# Patient Record
Sex: Male | Born: 1950 | Race: Black or African American | Hispanic: No | Marital: Married | Smoking: Former smoker
Health system: Southern US, Community
[De-identification: ages and names within clinical notes are randomized; demographics above are authoritative.]

## PROBLEM LIST (undated history)

## (undated) DIAGNOSIS — I10 Essential (primary) hypertension: Secondary | ICD-10-CM

## (undated) DIAGNOSIS — M1990 Unspecified osteoarthritis, unspecified site: Secondary | ICD-10-CM

## (undated) HISTORY — PX: COLONOSCOPY: SHX174

## (undated) SURGERY — ARTHROSCOPY, KNEE
Anesthesia: General | Laterality: Right

## (undated) SURGERY — ARTHROPLASTY, KNEE, TOTAL
Anesthesia: General | Laterality: Right

---
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
## 2005-02-14 ENCOUNTER — Emergency Department (HOSPITAL_COMMUNITY): Admission: EM | Admit: 2005-02-14 | Discharge: 2005-02-14 | Payer: Self-pay | Admitting: Emergency Medicine

## 2008-05-20 ENCOUNTER — Encounter: Admission: RE | Admit: 2008-05-20 | Discharge: 2008-05-20 | Payer: Self-pay | Admitting: Specialist

## 2010-03-25 ENCOUNTER — Encounter (INDEPENDENT_AMBULATORY_CARE_PROVIDER_SITE_OTHER): Payer: Self-pay | Admitting: *Deleted

## 2010-09-13 NOTE — Letter (Signed)
Summary: Colonoscopy Letter  [facility]  [address] [place], [place] [address]   Phone: [phone]  Fax: [phone]      March 25, 2010 MRN: [id number]   Jeffery Price [address] [place], [place]  [address]   Dear [name],   According to your medical record, it is time for you to schedule a Colonoscopy. The American Cancer Society recommends this procedure as a method to detect early colon cancer. Patients with a family history of colon cancer, or a personal history of colon polyps or inflammatory bowel disease are at increased risk.  This letter has beeen generated based on the recommendations made at the time of your procedure. If you feel that in your particular situation this may no longer apply, please contact our office.  Please call our office at [phone] to schedule this appointment or to update your records at your earliest convenience.  Thank you for cooperating with [place] to provide you with the very best care possible.   Sincerely,  Judie Petit T. Russella Dar, M.D.  [facility] [phone]

## 2010-11-16 ENCOUNTER — Other Ambulatory Visit: Payer: Self-pay | Admitting: Family Medicine

## 2010-11-16 ENCOUNTER — Ambulatory Visit
Admission: RE | Admit: 2010-11-16 | Discharge: 2010-11-16 | Disposition: A | Discharge status: Home / Self Care | Payer: Medicare Other | Source: Ambulatory Visit | Attending: Family Medicine | Admitting: Family Medicine

## 2010-11-16 DIAGNOSIS — G8929 Other chronic pain: Secondary | ICD-10-CM

## 2011-01-16 ENCOUNTER — Other Ambulatory Visit: Payer: Self-pay | Admitting: Neurosurgery

## 2011-01-16 DIAGNOSIS — M25552 Pain in left hip: Secondary | ICD-10-CM

## 2011-01-21 ENCOUNTER — Ambulatory Visit
Admission: RE | Admit: 2011-01-21 | Discharge: 2011-01-21 | Disposition: A | Discharge status: Home / Self Care | Payer: Medicare Other | Source: Ambulatory Visit | Attending: Neurosurgery | Admitting: Neurosurgery

## 2011-01-21 DIAGNOSIS — M25552 Pain in left hip: Secondary | ICD-10-CM

## 2011-05-15 HISTORY — PX: JOINT REPLACEMENT: SHX530

## 2011-05-26 ENCOUNTER — Other Ambulatory Visit (HOSPITAL_COMMUNITY): Payer: Self-pay | Admitting: Orthopedic Surgery

## 2011-05-26 ENCOUNTER — Encounter (HOSPITAL_COMMUNITY)
Admission: RE | Admit: 2011-05-26 | Discharge: 2011-05-26 | Disposition: A | Discharge status: Home / Self Care | Payer: Medicare Other | Source: Ambulatory Visit | Attending: Orthopedic Surgery | Admitting: Orthopedic Surgery

## 2011-05-26 DIAGNOSIS — M169 Osteoarthritis of hip, unspecified: Secondary | ICD-10-CM

## 2011-05-26 LAB — URINALYSIS, ROUTINE W REFLEX MICROSCOPIC
Glucose, UA: NEGATIVE mg/dL
Ketones, ur: 15 mg/dL — AB
Leukocytes, UA: NEGATIVE
pH: 5 (ref 5.0–8.0)

## 2011-05-26 LAB — CBC
HCT: 42.3 % (ref 39.0–52.0)
Hemoglobin: 14.9 g/dL (ref 13.0–17.0)
MCH: 33.7 pg (ref 26.0–34.0)
RBC: 4.42 MIL/uL (ref 4.22–5.81)

## 2011-05-26 LAB — BASIC METABOLIC PANEL
CO2: 27 mEq/L (ref 19–32)
Calcium: 10.1 mg/dL (ref 8.4–10.5)
Creatinine, Ser: 1.2 mg/dL (ref 0.50–1.35)
Glucose, Bld: 103 mg/dL — ABNORMAL HIGH (ref 70–99)

## 2011-05-26 LAB — SURGICAL PCR SCREEN: Staphylococcus aureus: NEGATIVE

## 2011-06-01 ENCOUNTER — Inpatient Hospital Stay (HOSPITAL_COMMUNITY): Payer: Medicare Other

## 2011-06-01 ENCOUNTER — Inpatient Hospital Stay (HOSPITAL_COMMUNITY)
Admission: RE | Admit: 2011-06-01 | Discharge: 2011-06-06 | DRG: 470 | Disposition: A | Discharge status: Home Health | Payer: Medicare Other | Source: Ambulatory Visit | Attending: Orthopedic Surgery | Admitting: Orthopedic Surgery

## 2011-06-01 DIAGNOSIS — M169 Osteoarthritis of hip, unspecified: Principal | ICD-10-CM | POA: Diagnosis present

## 2011-06-01 DIAGNOSIS — M1610 Unilateral primary osteoarthritis, unspecified hip: Principal | ICD-10-CM | POA: Diagnosis present

## 2011-06-01 DIAGNOSIS — I10 Essential (primary) hypertension: Secondary | ICD-10-CM | POA: Diagnosis present

## 2011-06-01 DIAGNOSIS — Z01812 Encounter for preprocedural laboratory examination: Secondary | ICD-10-CM

## 2011-06-01 LAB — HEPATIC FUNCTION PANEL
ALT: 20 U/L (ref 0–53)
Albumin: 3.6 g/dL (ref 3.5–5.2)
Alkaline Phosphatase: 46 U/L (ref 39–117)
Total Protein: 6.7 g/dL (ref 6.0–8.3)

## 2011-06-01 LAB — TYPE AND SCREEN: ABO/RH(D): A NEG

## 2011-06-02 LAB — CBC
MCHC: 35.8 g/dL (ref 30.0–36.0)
Platelets: 208 10*3/uL (ref 150–400)

## 2011-06-03 LAB — CBC
MCH: 32.6 pg (ref 26.0–34.0)
MCHC: 34.9 g/dL (ref 30.0–36.0)
RDW: 11.9 % (ref 11.5–15.5)
WBC: 9 10*3/uL (ref 4.0–10.5)

## 2011-06-04 LAB — PROTIME-INR: INR: 1.9 — ABNORMAL HIGH (ref 0.00–1.49)

## 2011-06-05 LAB — PROTIME-INR: Prothrombin Time: 28.8 seconds — ABNORMAL HIGH (ref 11.6–15.2)

## 2011-06-06 LAB — CBC
MCH: 32.7 pg (ref 26.0–34.0)
MCV: 92.1 fL (ref 78.0–100.0)
Platelets: 349 10*3/uL (ref 150–400)
RBC: 3.15 MIL/uL — ABNORMAL LOW (ref 4.22–5.81)
RDW: 11.7 % (ref 11.5–15.5)
WBC: 5.1 10*3/uL (ref 4.0–10.5)

## 2011-06-06 LAB — PROTIME-INR: Prothrombin Time: 28.9 seconds — ABNORMAL HIGH (ref 11.6–15.2)

## 2011-06-06 NOTE — Op Note (Signed)
[name], Jeffery Price NO.:  [id number]  MEDICAL RECORD NO.:  [id number]  LOCATION:  5010                         FACILITY:  [facility]  PHYSICIAN:  Myrtie Neither, MD      DATE OF BIRTH:  1951/01/22  DATE OF PROCEDURE: DATE OF DISCHARGE:                              OPERATIVE REPORT   PREOPERATIVE DIAGNOSES: 1. Degenerative joint disease, left hip 2. Acetabular cyst, left hip.  POSTOPERATIVE DIAGNOSES: 1. Degenerative joint disease, left hip 2. Acetabular cyst, left hip.  ANESTHESIA:  General.  PROCEDURE:  Left total hip arthroplasty, Biomet implant, bone grafting of cystic lesions in left acetabulum  The patient was taken to the operating room.  After given adequate preop medication and given general anesthesia and intubated, the patient was placed in left lateral position.  Left hip was prepped with DuraPrep and draped in sterile manner.  Bovie used for hemostasis.  Posterior southern approach was made over the left hip going through the skin and subcutaneous tissue and down through the fascia.  Short rotators were identified and released.  Capsule was released and excised.  Hip was dislocated.  Femoral neck cutting jig was put in place and femoral head was resected with the acetabulum exposed with retractors.  Soft tissue resection about the acetabulum was done.  Identification of 3 large cystic areas in the acetabulum were identified.  These were curetted. Reaming was then started with a 51 mm and proceeded up to 54 mm. Reaming was done down to the bleeding surface.  Bone graft was retrieved from the femoral head.  This was crashed and impact into the 3 cystic defects.  Reamer was then put into reverse to further impact the bone graft.  Acetabular shell, 53 mm, 4 hole, was then put in place and stabilized with 2 cancellous screws.  Trial acetabular liner with posterior lip was then put in place.  Attention was then turned to the proximal femur.  Reaming  down the femoral canal was started out with a cookie cutter and followed with reamer.  Rasping was done up to 11 mm, which was down to good cortical surface.  Calcar reamer was then put in place and proximal femur was smoothed down.  Large anterior lip osteophyte was identified and resected with the use of osteotome.  A -6, -3, and standard heads were trialled and -3 was found to give the most stability, full flexion and extension and internal and external rotation, good stability without subluxing.  Final implants that we used were a 54, 3-hole porous-coated shell, 36-mm acetabular liner, 2 cancellous screws, and 36-mm modular head, - 3.  With all implants in place, again range of motion was tested and found was to be good and stable, good internal and external rotation and good leg length.  Copious and abundant irrigation was done. Wound closure was then done with 0 Vicryl for the fascia, 2-0 for subcutaneous, and skin staples for the skin.  Hip abduction pillow was then applied.  Compressive dressing was applied.  The patient went to recovery room in stable and satisfactory condition.     Myrtie Neither, MD     AC/MEDQ  D:  06/01/2011  T:  06/01/2011  Job:  [id number]  Electronically Signed by Myrtie Neither MD on 06/06/2011 11:22:23 AM

## 2011-06-06 NOTE — H&P (Signed)
  Jeffery Price, KROTZER NO.:  [id number]  MEDICAL RECORD NO.:  [id number]  LOCATION:  5010                         FACILITY:  [facility]  PHYSICIAN:  Myrtie Neither, MD      DATE OF BIRTH:  05/22/1951  DATE OF ADMISSION:  06/01/2011 DATE OF DISCHARGE:                             HISTORY & PHYSICAL   CHIEF COMPLAINT:  Painful stiff left hip.  HISTORY OF PRESENT ILLNESS:  This is a 60 year old who has been having degenerative arthropathy involving this left hip for many years and recently has been able to get treatment with the use of Medicaid. Patient has been recently treated with anti-inflammatories and use of cane and as well as hydrocodone to relieve pain.  PAST MEDICAL HISTORY:  Hypertension, degenerative joint arthropathy.  No history of diabetes mellitus.  ALLERGIES:  None known.  SOCIAL HISTORY:  Has been that of use of alcohol but no use of tobacco or illegal drugs.  FAMILY HISTORY:  That of hypertension.  REVIEW OF SYSTEMS:  No cardiac, respiratory, no urinary or bowel symptoms.  MEDICATIONS:  That of hydrocodone 10/650, Decadron 4 mg p.r.n., amlodipine/benazepril 10/40 mg daily.  Etodolac 400 mg b.i.d.  PHYSICAL EXAMINATION:  GENERAL:  Alert and oriented, with obvious antalgic gait. VITAL SIGNS:  Blood pressure 164/90, respirations 18, pulse 102, temperature 99, weight 79 kg, height 67 inches, O2 saturation 98%. HEAD:  Normocephalic.  Eyes and conjunctivae are clear. NECK:  Supple. CHEST:  Clear. CARDIAC:  S1, S2, regular. EXTREMITIES:  Left hip tender anterior and lateral ankylotic with very little rotation, flexion up to 90 degrees and shortening on the left side, three-eighth of an inch.  LABORATORY DATA:  X-rays revealed sclerosis, cystic lesions in the acetabulum.  Osteophytes and sclerosis, severe degenerative joint disease.  IMPRESSION:  Severe degenerative joint disease left hip with a cystic lesion in the acetabulum,  hypertension.  PLAN:  Left total hip arthroplasty.     Myrtie Neither, MD     AC/MEDQ  D:  06/01/2011  T:  06/01/2011  Job:  [id number]  Electronically Signed by Myrtie Neither MD on 06/06/2011 11:22:28 AM

## 2011-06-06 NOTE — Discharge Summary (Signed)
  NAMETORRY, ISTRE NO.:  [id number]  MEDICAL RECORD NO.:  [id number]  LOCATION:  5010                         FACILITY:  [facility]  PHYSICIAN:  Myrtie Neither, MD      DATE OF BIRTH:  28-Oct-1950  DATE OF ADMISSION:  06/01/2011 DATE OF DISCHARGE:  06/06/2011                              DISCHARGE SUMMARY   ADMITTING DIAGNOSIS:  Degenerative joint disease, left hip.  DISCHARGE DIAGNOSIS:  Degenerative joint disease, left hip.  COMPLICATIONS:  None.  INFECTIONS:  None.  OPERATION:  Left total hip arthroplasty.  PERTINENT HISTORY:  A 60 year old male being followed for degenerative arthropathy of his left hip in the office.  The patient is being treated with anti-inflammatories and pain medication and with use of cane.  The patient has been unable to get treatment up until recently due to lack of insurance coverage.  Pertinent physical was that of the left hip tender anterior and laterally with very little range of motion, very little rotation, flexion up to 90 degrees, and shortening of the __________.  X-rays revealed sclerosis, cystic, and lesions in the acetabulum, osteophytes, and sclerosis.  HOSPITAL COURSE:  The patient underwent preop laboratory; CBC, UA, CMET, PT, PTT, platelet count, EKG, chest x-ray.  The patient's labs found to be stable enough to undergo surgery.  The patient underwent left total hip arthroplasty.  He tolerated the procedure quite well, had difficulty with pain control but was brought under control with full-dose Dilaudid IV.  The patient's pain is presently under control with use of Percocet 1-2 q.4 p.r.n. for pain.  Partial weightbearing. 50% on the left side, physical therapy.  His INR is 1.9.  H and H is stable at 10 and 33.  The patient is afebrile.  Wound is healing quite well, swelling is down. The patient is using spirometry quite well.  The patient will be discharged with Home Health and Physical Therapy to return to the  office in 1 week.  The patient is being discharged on Percocet 1-2 q.4 p.r.n. for pain, ferrous sulfate 325 mg t.i.d., Robaxin 500 mg b.i.d., and Coumadin 5 mg daily.  Home Health and Physical Therapy with INRs to be checked for regulation of Coumadin.  The patient is being discharged in stable and satisfactory condition.     Myrtie Neither, MD     AC/MEDQ  D:  06/05/2011  T:  06/05/2011  Job:  [id number]  Electronically Signed by Myrtie Neither MD on 06/06/2011 11:22:36 AM

## 2011-06-07 LAB — TYPE AND SCREEN
ABO/RH(D): A NEG
Antibody Screen: NEGATIVE
PT AG Type: NEGATIVE
Unit division: 0

## 2011-11-17 ENCOUNTER — Other Ambulatory Visit: Payer: Self-pay | Admitting: Orthopedic Surgery

## 2011-11-17 ENCOUNTER — Ambulatory Visit
Admission: RE | Admit: 2011-11-17 | Discharge: 2011-11-17 | Disposition: A | Discharge status: Home / Self Care | Payer: Medicare Other | Source: Ambulatory Visit | Attending: Orthopedic Surgery | Admitting: Orthopedic Surgery

## 2011-11-17 DIAGNOSIS — M25569 Pain in unspecified knee: Secondary | ICD-10-CM

## 2011-12-27 ENCOUNTER — Encounter (HOSPITAL_COMMUNITY): Payer: Self-pay | Admitting: Respiratory Therapy

## 2011-12-29 ENCOUNTER — Encounter (HOSPITAL_COMMUNITY): Payer: Self-pay

## 2011-12-29 ENCOUNTER — Encounter (HOSPITAL_COMMUNITY)
Admission: RE | Admit: 2011-12-29 | Discharge: 2011-12-29 | Disposition: A | Discharge status: Home / Self Care | Payer: Medicare Other | Source: Ambulatory Visit | Attending: Orthopedic Surgery | Admitting: Orthopedic Surgery

## 2011-12-29 HISTORY — DX: Essential (primary) hypertension: I10

## 2011-12-29 HISTORY — DX: Unspecified osteoarthritis, unspecified site: M19.90

## 2011-12-29 LAB — CBC
MCH: 33.3 pg (ref 26.0–34.0)
MCV: 95.3 fL (ref 78.0–100.0)
Platelets: 236 10*3/uL (ref 150–400)
RBC: 4.26 MIL/uL (ref 4.22–5.81)
RDW: 13 % (ref 11.5–15.5)

## 2011-12-29 LAB — BASIC METABOLIC PANEL
BUN: 8 mg/dL (ref 6–23)
Calcium: 9.6 mg/dL (ref 8.4–10.5)
Creatinine, Ser: 1.08 mg/dL (ref 0.50–1.35)
GFR calc Af Amer: 82 mL/min — ABNORMAL LOW (ref >90)
GFR calc non Af Amer: 71 mL/min — ABNORMAL LOW (ref >90)
Glucose, Bld: 97 mg/dL (ref 70–99)
Potassium: 3.7 mEq/L (ref 3.5–5.1)

## 2011-12-29 LAB — SURGICAL PCR SCREEN: MRSA, PCR: NEGATIVE

## 2011-12-29 NOTE — Progress Notes (Signed)
Telephone call to Dr Loleta Chance @[facility]. Pt off BP meds for 3 months due to expense. Dr Loleta Chance will see patient today @ 2:45 for evaluation and treatment of HTN.

## 2011-12-29 NOTE — Pre-Procedure Instructions (Signed)
20 DIONTA LARKE  12/29/2011   Your procedure is scheduled on:  Thursday, May 23  Report to [facility] at 0700 AM.  Call this number if you have problems the morning of surgery: [phone]   Remember:   Do not eat food:After Midnight.  May have clear liquids: up to 4 Hours before arrival.( 3:00am)  Clear liquids include soda, tea, black coffee, apple or grape juice, broth.  Take these medicines the morning of surgery with A SIP OF WATER: *Call to discuss blood pressure medication**   Do not wear jewelry, make-up or nail polish.  Do not wear lotions, powders, or perfumes. You may wear deodorant.  Do not shave 48 hours prior to surgery. Men may shave face and neck.  Do not bring valuables to the hospital.  Contacts, dentures or bridgework may not be worn into surgery.  Leave suitcase in the car. After surgery it may be brought to your room.  For patients admitted to the hospital, checkout time is 11:00 AM the day of discharge.   Patients discharged the day of surgery will not be allowed to drive home.  Name and phone number of your driver: *Spouse, Margarett**  Special Instructions: CHG Shower Use Special Wash: 1/2 bottle night before surgery and 1/2 bottle morning of surgery.   Please read over the following fact sheets that you were given: Pain Booklet, Coughing and Deep Breathing, MRSA Information and Surgical Site Infection Prevention

## 2012-01-01 ENCOUNTER — Other Ambulatory Visit: Payer: Self-pay | Admitting: Orthopedic Surgery

## 2012-01-01 NOTE — Progress Notes (Signed)
Dr. Celene Skeen office notified that orders not in EPIC.  Receptionist stated she would tell nurse/Dr. Carter.//L. Starla Deller,RN

## 2012-01-01 NOTE — Progress Notes (Signed)
Pt called to ask if he should take his BP med- amlodipine the am of surgery.  I informed him that "yes" he should take it the am of surgery.

## 2012-01-02 NOTE — Progress Notes (Signed)
Bmet obtained during PAT appt prior to receiving Dr. Celene Skeen orders.  Hepatic Function ordered to obtain missing labs for CMET results.//L. Ricardo Schubach,RN

## 2012-01-03 MED ORDER — CHLORHEXIDINE GLUCONATE 4 % EX LIQD: 60.0000 mL | Freq: Once | CUTANEOUS | Status: DC

## 2012-01-03 MED ORDER — CEFAZOLIN SODIUM 1-5 GM-% IV SOLN: 1.0000 g | INTRAVENOUS | Status: DC

## 2012-01-03 MED ORDER — CEFAZOLIN SODIUM-DEXTROSE 2-3 GM-% IV SOLR
2.0000 g | INTRAVENOUS | Status: AC
  Administered 2012-01-04: 2 g via INTRAVENOUS
  Filled 2012-01-03: qty 50

## 2012-01-04 ENCOUNTER — Encounter (HOSPITAL_COMMUNITY)
Admission: RE | Disposition: A | Discharge status: Home / Self Care | Payer: Self-pay | Source: Ambulatory Visit | Attending: Orthopedic Surgery

## 2012-01-04 ENCOUNTER — Encounter (HOSPITAL_COMMUNITY): Payer: Self-pay | Admitting: Anesthesiology

## 2012-01-04 ENCOUNTER — Ambulatory Visit (HOSPITAL_COMMUNITY): Payer: Medicare Other | Admitting: Anesthesiology

## 2012-01-04 ENCOUNTER — Ambulatory Visit (HOSPITAL_COMMUNITY)
Admission: RE | Admit: 2012-01-04 | Discharge: 2012-01-04 | Disposition: A | Discharge status: Home / Self Care | Payer: Medicare Other | Source: Ambulatory Visit | Attending: Orthopedic Surgery | Admitting: Orthopedic Surgery

## 2012-01-04 ENCOUNTER — Encounter (HOSPITAL_COMMUNITY): Payer: Self-pay | Admitting: *Deleted

## 2012-01-04 DIAGNOSIS — M23302 Other meniscus derangements, unspecified lateral meniscus, unspecified knee: Secondary | ICD-10-CM | POA: Insufficient documentation

## 2012-01-04 DIAGNOSIS — M1A9XX0 Chronic gout, unspecified, without tophus (tophi): Secondary | ICD-10-CM | POA: Insufficient documentation

## 2012-01-04 DIAGNOSIS — I10 Essential (primary) hypertension: Secondary | ICD-10-CM | POA: Insufficient documentation

## 2012-01-04 DIAGNOSIS — Z01812 Encounter for preprocedural laboratory examination: Secondary | ICD-10-CM | POA: Insufficient documentation

## 2012-01-04 DIAGNOSIS — M1A00X0 Idiopathic chronic gout, unspecified site, without tophus (tophi): Secondary | ICD-10-CM | POA: Insufficient documentation

## 2012-01-04 DIAGNOSIS — M23305 Other meniscus derangements, unspecified medial meniscus, unspecified knee: Secondary | ICD-10-CM | POA: Insufficient documentation

## 2012-01-04 DIAGNOSIS — G8918 Other acute postprocedural pain: Secondary | ICD-10-CM

## 2012-01-04 HISTORY — PX: KNEE ARTHROSCOPY: SHX127

## 2012-01-04 LAB — HEPATIC FUNCTION PANEL
ALT: 22 U/L (ref 0–53)
AST: 37 U/L (ref 0–37)
Albumin: 3.9 g/dL (ref 3.5–5.2)
Alkaline Phosphatase: 53 U/L (ref 39–117)
Total Bilirubin: 0.4 mg/dL (ref 0.3–1.2)
Total Protein: 7.4 g/dL (ref 6.0–8.3)

## 2012-01-04 SURGERY — ARTHROSCOPY, KNEE
Anesthesia: General | Site: Knee | Laterality: Right | Wound class: Clean

## 2012-01-04 MED ORDER — LIDOCAINE HCL (CARDIAC) 20 MG/ML IV SOLN
INTRAVENOUS | Status: DC | PRN
  Administered 2012-01-04: 80 mg via INTRAVENOUS

## 2012-01-04 MED ORDER — PROPOFOL 10 MG/ML IV EMUL
INTRAVENOUS | Status: DC | PRN
  Administered 2012-01-04: 50 mg via INTRAVENOUS
  Administered 2012-01-04: 200 mg via INTRAVENOUS

## 2012-01-04 MED ORDER — LACTATED RINGERS IV SOLN
INTRAVENOUS | Status: DC
  Administered 2012-01-04: 08:00:00 via INTRAVENOUS

## 2012-01-04 MED ORDER — LACTATED RINGERS IV SOLN
INTRAVENOUS | Status: DC | PRN
  Administered 2012-01-04 (×2): via INTRAVENOUS

## 2012-01-04 MED ORDER — BUPIVACAINE HCL (PF) 0.25 % IJ SOLN
INTRAMUSCULAR | Status: DC | PRN
  Administered 2012-01-04: 20 mL

## 2012-01-04 MED ORDER — MIDAZOLAM HCL 2 MG/2ML IJ SOLN: 1.0000 mg | INTRAMUSCULAR | Status: DC | PRN

## 2012-01-04 MED ORDER — MIDAZOLAM HCL 5 MG/5ML IJ SOLN
INTRAMUSCULAR | Status: DC | PRN
  Administered 2012-01-04 (×2): 1 mg via INTRAVENOUS

## 2012-01-04 MED ORDER — LORAZEPAM 2 MG/ML IJ SOLN
1.0000 mg | Freq: Once | INTRAMUSCULAR | Status: DC | PRN
Start: 2012-01-04 — End: 2012-01-04

## 2012-01-04 MED ORDER — HYDROMORPHONE HCL PF 1 MG/ML IJ SOLN
0.2500 mg | INTRAMUSCULAR | Status: DC | PRN
  Administered 2012-01-04 (×2): 0.5 mg via INTRAVENOUS

## 2012-01-04 MED ORDER — DEXTROSE 5 % IV SOLN
INTRAVENOUS | Status: DC | PRN
  Administered 2012-01-04: 09:00:00 via INTRAVENOUS

## 2012-01-04 MED ORDER — FENTANYL CITRATE 0.05 MG/ML IJ SOLN: 50.0000 ug | INTRAMUSCULAR | Status: DC | PRN

## 2012-01-04 MED ORDER — SODIUM CHLORIDE 0.9 % IR SOLN
Status: DC | PRN
  Administered 2012-01-04: 1000 mL

## 2012-01-04 MED ORDER — ONDANSETRON HCL 4 MG/2ML IJ SOLN
INTRAMUSCULAR | Status: DC | PRN
  Administered 2012-01-04: 4 mg via INTRAVENOUS

## 2012-01-04 MED ORDER — OXYCODONE-ACETAMINOPHEN 7.5-325 MG PO TABS: 1.0000 | ORAL_TABLET | ORAL | Status: AC | PRN

## 2012-01-04 MED ORDER — FENTANYL CITRATE 0.05 MG/ML IJ SOLN
INTRAMUSCULAR | Status: DC | PRN
  Administered 2012-01-04: 100 ug via INTRAVENOUS
  Administered 2012-01-04 (×2): 50 ug via INTRAVENOUS

## 2012-01-04 SURGICAL SUPPLY — 33 items
BANDAGE ELASTIC 4 VELCRO ST LF (GAUZE/BANDAGES/DRESSINGS) ×2 IMPLANT
BANDAGE ELASTIC 6 VELCRO ST LF (GAUZE/BANDAGES/DRESSINGS) ×2 IMPLANT
BANDAGE GAUZE ELAST BULKY 4 IN (GAUZE/BANDAGES/DRESSINGS) ×2 IMPLANT
BLADE CUDA 5.5 (BLADE) IMPLANT
BLADE GREAT WHITE 4.2 (BLADE) ×2 IMPLANT
CLOTH BEACON ORANGE TIMEOUT ST (SAFETY) ×2 IMPLANT
COVER SURGICAL LIGHT HANDLE (MISCELLANEOUS) ×2 IMPLANT
CUFF TOURNIQUET SINGLE 34IN LL (TOURNIQUET CUFF) ×1 IMPLANT
CUFF TOURNIQUET SINGLE 44IN (TOURNIQUET CUFF) IMPLANT
DECANTER SPIKE VIAL GLASS SM (MISCELLANEOUS) ×2 IMPLANT
DRAPE ARTHROSCOPY W/POUCH 114 (DRAPES) ×2 IMPLANT
DRAPE U-SHAPE 47X51 STRL (DRAPES) ×2 IMPLANT
DRSG EMULSION OIL 3X3 NADH (GAUZE/BANDAGES/DRESSINGS) ×2 IMPLANT
DRSG PAD ABDOMINAL 8X10 ST (GAUZE/BANDAGES/DRESSINGS) ×2 IMPLANT
DURAPREP 26ML APPLICATOR (WOUND CARE) ×2 IMPLANT
GLOVE SS PI 9.0 STRL (GLOVE) ×2 IMPLANT
GOWN PREVENTION PLUS XLARGE (GOWN DISPOSABLE) ×2 IMPLANT
GOWN STRL NON-REIN LRG LVL3 (GOWN DISPOSABLE) ×2 IMPLANT
KIT BASIN OR (CUSTOM PROCEDURE TRAY) ×2 IMPLANT
KIT ROOM TURNOVER OR (KITS) ×2 IMPLANT
MANIFOLD NEPTUNE II (INSTRUMENTS) ×2 IMPLANT
PACK ARTHROSCOPY DSU (CUSTOM PROCEDURE TRAY) ×2 IMPLANT
PAD ARMBOARD 7.5X6 YLW CONV (MISCELLANEOUS) ×4 IMPLANT
PAD CAST 4YDX4 CTTN HI CHSV (CAST SUPPLIES) IMPLANT
PADDING CAST COTTON 4X4 STRL (CAST SUPPLIES) ×2
SET ARTHROSCOPY TUBING (MISCELLANEOUS) ×2
SET ARTHROSCOPY TUBING LN (MISCELLANEOUS) ×1 IMPLANT
SPONGE GAUZE 4X4 12PLY (GAUZE/BANDAGES/DRESSINGS) ×2 IMPLANT
SPONGE LAP 4X18 X RAY DECT (DISPOSABLE) ×2 IMPLANT
SUT ETHILON 4 0 PS 2 18 (SUTURE) ×2 IMPLANT
SYR CONTROL 10ML LL (SYRINGE) ×2 IMPLANT
TOWEL OR 17X24 6PK STRL BLUE (TOWEL DISPOSABLE) ×2 IMPLANT
WATER STERILE IRR 1000ML POUR (IV SOLUTION) ×2 IMPLANT

## 2012-01-04 NOTE — Transfer of Care (Signed)
Immediate Anesthesia Transfer of Care Note  Patient: Jeffery Price  Procedure(s) Performed: Procedure(s) (LRB): ARTHROSCOPY KNEE (Right)  Patient Location: PACU  Anesthesia Type: General  Level of Consciousness: awake, alert  and oriented  Airway & Oxygen Therapy: Patient Spontanous Breathing and Patient connected to nasal cannula oxygen  Post-op Assessment: Report given to PACU RN, Post -op Vital signs reviewed and stable and Patient moving all extremities  Post vital signs: Reviewed and stable  Complications: No apparent anesthesia complications

## 2012-01-04 NOTE — Preoperative (Signed)
Beta Blockers   Reason not to administer Beta Blockers:Not Applicable 

## 2012-01-04 NOTE — Anesthesia Postprocedure Evaluation (Signed)
  Anesthesia Post-op Note  Patient: Jeffery Price  Procedure(s) Performed: Procedure(s) (LRB): ARTHROSCOPY KNEE (Right)  Patient Location: PACU  Anesthesia Type: General  Level of Consciousness: awake  Airway and Oxygen Therapy: Patient Spontanous Breathing  Post-op Pain: mild  Post-op Assessment: Post-op Vital signs reviewed, Patient's Cardiovascular Status Stable, Respiratory Function Stable, Patent Airway, No signs of Nausea or vomiting and Pain level controlled  Post-op Vital Signs: stable  Complications: No apparent anesthesia complications

## 2012-01-04 NOTE — H&P (Signed)
Jeffery Price is an 61 y.o. male.   Chief Complaint: PAINFUL  RIGHT KNEE HPI: PATIENT C/O PERSISTENT PAIN SWELLING THAT DOES NOT GO DOWN.  Past Medical History  Diagnosis Date  . Hypertension     not taking midication due to expense  . Arthritis     osteoarthritis  . Swelling of joint, knee, right     Past Surgical History  Procedure Date  . Knee ligament reconstruction 1969    left knee  . Colonoscopy   . Joint replacement 10-,2012    left hip    Family History  Problem Relation Age of Onset  . Anesthesia problems Neg Hx    Social History:  reports that he has quit smoking. He has never used smokeless tobacco. He reports that he drinks alcohol. He reports that he does not use illicit drugs.  Allergies: No Known Allergies  Medications Prior to Admission  Medication Sig Dispense Refill  . etodolac (LODINE) 400 MG tablet Take 400 mg by mouth 2 (two) times daily.      . furosemide (LASIX) 20 MG tablet Take 20 mg by mouth daily.      Marland Kitchen HYDROcodone-acetaminophen (LORCET) 10-650 MG per tablet Take 1 tablet by mouth every 8 (eight) hours as needed. For pain        Results for orders placed during the hospital encounter of 01/04/12 (from the past 48 hour(s))  HEPATIC FUNCTION PANEL     Status: Normal   Collection Time   01/04/12  7:19 AM      Component Value Range Comment   Total Protein 7.4  6.0 - 8.3 (g/dL)    Albumin 3.9  3.5 - 5.2 (g/dL)    AST 37  0 - 37 (U/L)    ALT 22  0 - 53 (U/L)    Alkaline Phosphatase 53  39 - 117 (U/L)    Total Bilirubin 0.4  0.3 - 1.2 (mg/dL)    Bilirubin, Direct 0.1  0.0 - 0.3 (mg/dL)    Indirect Bilirubin 0.3  0.3 - 0.9 (mg/dL)    No results found.  Review of Systems  Constitutional: Negative.   HENT: Negative.   Eyes: Negative.   Respiratory: Negative.   Cardiovascular: Negative.   Gastrointestinal: Negative.   Genitourinary: Negative.   Musculoskeletal: Negative for joint pain.  Skin: Negative.   Neurological: Negative.     Endo/Heme/Allergies: Negative.   Psychiatric/Behavioral: Negative.     Blood pressure 149/90, pulse 81, temperature 98.6 F (37 C), temperature source Oral, resp. rate 20, SpO2 98.00%. Physical Exam   Assessment/Plan INTERNAL DERANGEMENT RIGHT KNEE/PLAN ARTHROSCOPY RIGHT KNEE  Louretta Tantillo F 01/04/2012, 8:35 AM

## 2012-01-04 NOTE — Anesthesia Preprocedure Evaluation (Addendum)
Anesthesia Evaluation  Patient identified by MRN, date of birth, ID band Patient awake    Reviewed: Allergy & Precautions, H&P , NPO status , Patient's Chart, lab work & pertinent test results  Airway Mallampati: I TM Distance: >3 FB Neck ROM: Full    Dental   Pulmonary former smoker   Pulmonary exam normal       Cardiovascular hypertension, Pt. on medications     Neuro/Psych    GI/Hepatic   Endo/Other    Renal/GU      Musculoskeletal  (+) Arthritis -, Osteoarthritis,    Abdominal   Peds  Hematology   Anesthesia Other Findings   Reproductive/Obstetrics                         Anesthesia Physical Anesthesia Plan  ASA: II  Anesthesia Plan: General   Post-op Pain Management:    Induction: Intravenous  Airway Management Planned: LMA  Additional Equipment:   Intra-op Plan:   Post-operative Plan: Extubation in OR  Informed Consent: I have reviewed the patients History and Physical, chart, labs and discussed the procedure including the risks, benefits and alternatives for the proposed anesthesia with the patient or authorized representative who has indicated his/her understanding and acceptance.   Dental advisory given  Plan Discussed with: Surgeon and CRNA  Anesthesia Plan Comments:        Anesthesia Quick Evaluation

## 2012-01-04 NOTE — Anesthesia Procedure Notes (Signed)
Procedure Name: LMA Insertion Date/Time: 01/04/2012 9:04 AM Performed by: Neomia Dear, Kadyn Chovan K Pre-anesthesia Checklist: Patient identified, Timeout performed, Emergency Drugs available, Suction available and Patient being monitored Patient Re-evaluated:Patient Re-evaluated prior to inductionOxygen Delivery Method: Circle system utilized Intubation Type: IV induction Ventilation: Mask ventilation without difficulty LMA: LMA inserted LMA Size: 5.0 Number of attempts: 1 Placement Confirmation: breath sounds checked- equal and bilateral and positive ETCO2 Tube secured with: Tape Dental Injury: Teeth and Oropharynx as per pre-operative assessment

## 2012-01-04 NOTE — Brief Op Note (Signed)
01/04/2012  9:42 AM  PATIENT:  Anne Ng  61 y.o. male  PRE-OPERATIVE DIAGNOSIS:  Internal Derangement Right Knee  POST-OPERATIVE DIAGNOSIS:  Internal Derangement Right Knee  PROCEDURE:  Procedure(s) (LRB): ARTHROSCOPY KNEE (Right)  SURGEON:  Surgeon(s) and Role:    * Kennieth Rad, MD - Primary  PHYSICIAN ASSISTANT:   ASSISTANTS: none   ANESTHESIA:   general  EBL:  Total I/O In: 1050 [I.V.:1050] Out: -   BLOOD ADMINISTERED:none  DRAINS: none   LOCAL MEDICATIONS USED:  MARCAINE     SPECIMEN:  No Specimen  DISPOSITION OF SPECIMEN:  N/A  COUNTS:  YES  TOURNIQUET:  * Missing tourniquet times found for documented tourniquets in log:  [id number] *  DICTATION: .Other Dictation: Dictation Number [id number]  PLAN OF CARE: Discharge to home after PACU  PATIENT DISPOSITION:  PACU - hemodynamically stable.   Delay start of Pharmacological VTE agent (>24hrs) due to surgical blood loss or risk of bleeding: not applicable

## 2012-01-05 NOTE — Op Note (Signed)
NAME:  Jeffery Price, Jeffery Price NO.:  [id number]  MEDICAL RECORD NO.:  [id number]  LOCATION:  [facility]                         FACILITY:  [facility]  PHYSICIAN:  Myrtie Neither, MD      DATE OF BIRTH:  [birth year]  DATE OF PROCEDURE:  01/04/2012 DATE OF DISCHARGE:                              OPERATIVE REPORT   PREOPERATIVE DIAGNOSIS:  Internal derangement, right knee.  POSTOPERATIVE DIAGNOSIS:  Medial meniscal tear, lateral meniscal tear, chondral defect, left medial femoral condyle, and chronic synovitic changes with gouty crystals.  ANESTHESIA:  General.  PROCEDURE:  Medial meniscectomy and lateral meniscectomy, chondroplasty, medial and lateral femoral condyles, and complete synovectomy.  The patient was taken to the operating room.  After given adequate preop medications, given general anesthesia and intubated.  Right knee was prepped with DuraPrep and draped in sterile manner.  Tourniquet used for hemostasis.  One-half inch puncture wound made along the anterior medial lateral joint line, inferior wall to the medial suprapatellar pouch area.  Evacuation of large amount of clear synovial fluid was initially done.  This was through the trocar from the water source.  Inspection of the joint revealed medial and lateral meniscal tears, degenerative joint arthropathy with gouty changes both medial and lateral femoral condyles, loose fragments and gout crystals in the suprapatellar pouch with thickened patella plaque and synovium both medial and lateral compartment.  Complete synovectomy was done followed by a medial and lateral meniscectomy with use of meniscal shaver as well as chondroplasty done both medial and lateral femoral condyle.  After adequate debridement, copious amount of irrigation was done.  The ACL and PCL were still intact.  Wound closure was then done with 4-0 nylon. 20 mL of 0.25% plain Marcaine was injected to the knee.  Compressive dressing was applied.  The  patient tolerated the procedure quite well, went to recovery room in stable and satisfactory condition.  The patient being discharged home on Percocet 1-2 q.4 p.r.n. for pain, ice packs, partial weightbearing on the right side.  The patient is to return to the office in 1 week.  The patient being discharged in stable and satisfactory condition.     Myrtie Neither, MD     AC/MEDQ  D:  01/04/2012  T:  01/04/2012  Job:  [id number]

## 2012-01-09 ENCOUNTER — Encounter (HOSPITAL_COMMUNITY): Payer: Self-pay | Admitting: Orthopedic Surgery

## 2012-03-02 ENCOUNTER — Emergency Department (HOSPITAL_COMMUNITY)
Admission: EM | Admit: 2012-03-02 | Discharge: 2012-03-02 | Disposition: A | Discharge status: Home / Self Care | Payer: Medicare Other | Attending: Emergency Medicine | Admitting: Emergency Medicine

## 2012-03-02 ENCOUNTER — Encounter (HOSPITAL_COMMUNITY): Payer: Self-pay | Admitting: Emergency Medicine

## 2012-03-02 DIAGNOSIS — S61209A Unspecified open wound of unspecified finger without damage to nail, initial encounter: Secondary | ICD-10-CM | POA: Insufficient documentation

## 2012-03-02 DIAGNOSIS — Y9389 Activity, other specified: Secondary | ICD-10-CM | POA: Insufficient documentation

## 2012-03-02 DIAGNOSIS — W01119A Fall on same level from slipping, tripping and stumbling with subsequent striking against unspecified sharp object, initial encounter: Secondary | ICD-10-CM | POA: Insufficient documentation

## 2012-03-02 DIAGNOSIS — W268XXA Contact with other sharp object(s), not elsewhere classified, initial encounter: Secondary | ICD-10-CM | POA: Insufficient documentation

## 2012-03-02 DIAGNOSIS — Y998 Other external cause status: Secondary | ICD-10-CM | POA: Insufficient documentation

## 2012-03-02 DIAGNOSIS — IMO0002 Reserved for concepts with insufficient information to code with codable children: Secondary | ICD-10-CM

## 2012-03-02 MED ORDER — LIDOCAINE HCL 1 % IJ SOLN
INTRAMUSCULAR | Status: AC
  Filled 2012-03-02: qty 20

## 2012-03-02 MED ORDER — TETANUS-DIPHTH-ACELL PERTUSSIS 5-2.5-18.5 LF-MCG/0.5 IM SUSP
0.5000 mL | Freq: Once | INTRAMUSCULAR | Status: AC
  Administered 2012-03-02: 0.5 mL via INTRAMUSCULAR
  Filled 2012-03-02: qty 0.5

## 2012-03-02 NOTE — ED Provider Notes (Signed)
8:03 AM Patient reports a fall 1.5 days ago. Was sent by Dr Montez Morita to have a laceration repair of his left index finger. Dr. Montez Morita currently in the room suturing patient.   Large 2cm laceration on palmar surface of left 2nd proximal phalanx  8:52 AM Dr. Montez Morita discharged the patient. Provided patient with laceration instruction care   Thomasene Lot, PA-C 03/02/12 [phone]

## 2012-03-02 NOTE — ED Notes (Signed)
Pt was working on truck Thursday evening, fell and grabbed the bumper to his truck to pull himself up. Sustained laceration to left index finger at that time. Pt seen in Dr. Celene Skeen office on Friday morning and placed on antibiotics. Dr. Montez Morita is to come suture laceration this a.m. In ED

## 2012-03-02 NOTE — ED Provider Notes (Signed)
Medical screening examination/treatment/procedure(s) were performed by non-physician practitioner and as supervising physician I was immediately available for consultation/collaboration.  Rodnisha Blomgren, MD 03/02/12 1754 

## 2012-03-02 NOTE — ED Notes (Signed)
Dr. Montez Morita called to notify of pt arrival.  Dr. Montez Morita is enroute to [facility] at this time.  Suture items requested placed at bedside.

## 2012-03-02 NOTE — ED Notes (Signed)
ZOX:[id number]<AV> Expected date:<BR> Expected time:<BR> Means of arrival:<BR> Comments:<BR> Hold for Tr 1

## 2012-03-03 NOTE — H&P (Signed)
NAMEJACORI, MULROONEY NO.:  [id number]  MEDICAL RECORD NO.:  [id number]  LOCATION:  [facility]                         FACILITY:  [facility]  PHYSICIAN:  Myrtie Neither, MD      DATE OF BIRTH:  [birth year]  DATE OF ADMISSION:  03/02/2012 DATE OF DISCHARGE:  03/02/2012                             HISTORY & PHYSICAL   HISTORY OF PRESENT ILLNESS:  This is a 61 year old male who had sustained laceration to the left index finger on February 29, 2012, after a fall.  He grabbed a hook of his truck in order to get himself up and cut the left index finger.  The patient did temporary treatment to his left index finger, and was seen in the office on March 01, 2012, and found to have an elliptical type of laceration at the volar aspect of the proximal phalanx, plantar area of the left index finger down through subcutaneous tissue, but the tendons were intact.  The patient was scheduled for outpatient repair of this at [facility].  ALLERGIES:  None known.  PERTINENT PHYSICAL:  VITAL SIGNS:  Temperature 98.7, pulse 96, respirations 18, blood pressure 142/86, O2 saturation 100%, MUSCULOSKELETAL:  On examination of the left index finger, the patient has full flexion and extension.  Sensory is intact.  An elliptical 3 inch laceration involving the volar aspect over the proximal phalangeal area of the index finger.  No purulent material.  No sign of infection. Pink subcutaneous tissue present.  IMPRESSION:  Laceration of left index finger.  PLAN:  The patient underwent a local block and irrigation and cleansing with Betadine solution.  Procedure, irrigation and skin debridement with wound closure with the use of 4-0 nylon.  The wound itself was debrided about the skin edges after cleansing followed by closure with the use of 4-0 nylon and placement of Xeroform dressing to the left index finger. The patient had previously been on Augmentin 500 mg b.i.d.  The patient to continue  the Augmentin and return to the office in 1 week.  Elevation and ice packs to the left hand and use of the Percocet 1 q.8 hours p.r.n. for pain.  The patient is being discharged in stable and satisfactory condition from the ER.     Myrtie Neither, MD     AC/MEDQ  D:  03/02/2012  T:  03/02/2012  Job:  [id number]

## 2012-04-02 ENCOUNTER — Encounter (HOSPITAL_COMMUNITY): Payer: Self-pay | Admitting: Pharmacy Technician

## 2012-04-10 ENCOUNTER — Other Ambulatory Visit: Payer: Self-pay | Admitting: Orthopedic Surgery

## 2012-04-10 NOTE — Pre-Procedure Instructions (Signed)
20 Jeffery Price  04/10/2012   Your procedure is scheduled on:  04-18-2012  Report to [facility] at 5:30 AM.  Call this number if you have problems the morning of surgery: [phone]   Remember:   Do not eat food or drink:After Midnight.  .   Take these medicines the morning of surgery with A SIP OF WATER: pain medication as needed   Do not wear jewelry  Do not wear lotions, powders, or perfumes. You may wear deodorant.  Do not shave 48 hours prior to surgery. Men may shave face and neck.  Do not bring valuables to the hospital.  Contacts, dentures or bridgework may not be worn into surgery.  Leave suitcase in the car. After surgery it may be brought to your room.  For patients admitted to the hospital, checkout time is 11:00 AM the day of discharge.     Special Instructions: Incentive Spirometry - Practice and bring it with you on the day of surgery. and CHG Shower Use Special Wash: 1/2 bottle night before surgery and 1/2 bottle morning of surgery.   Please read over the following fact sheets that you were given: Pain Booklet, Blood Transfusion Information, MRSA Information and Surgical Site Infection Prevention

## 2012-04-11 ENCOUNTER — Encounter (HOSPITAL_COMMUNITY): Payer: Self-pay

## 2012-04-11 ENCOUNTER — Encounter (HOSPITAL_COMMUNITY)
Admission: RE | Admit: 2012-04-11 | Discharge: 2012-04-11 | Disposition: A | Discharge status: Home / Self Care | Payer: Medicare Other | Source: Ambulatory Visit | Attending: Orthopedic Surgery | Admitting: Orthopedic Surgery

## 2012-04-11 LAB — COMPREHENSIVE METABOLIC PANEL
Albumin: 3.7 g/dL (ref 3.5–5.2)
BUN: 11 mg/dL (ref 6–23)
Creatinine, Ser: 1.11 mg/dL (ref 0.50–1.35)
Potassium: 4.6 mEq/L (ref 3.5–5.1)
Total Protein: 7.4 g/dL (ref 6.0–8.3)

## 2012-04-11 LAB — URINALYSIS, ROUTINE W REFLEX MICROSCOPIC
Nitrite: NEGATIVE
Specific Gravity, Urine: 1.021 (ref 1.005–1.030)
Urobilinogen, UA: 0.2 mg/dL (ref 0.0–1.0)

## 2012-04-11 LAB — SURGICAL PCR SCREEN
MRSA, PCR: NEGATIVE
Staphylococcus aureus: NEGATIVE

## 2012-04-11 LAB — TYPE AND SCREEN
ABO/RH(D): A NEG
Antibody Screen: NEGATIVE

## 2012-04-11 LAB — CBC
HCT: 42.3 % (ref 39.0–52.0)
MCHC: 34.5 g/dL (ref 30.0–36.0)
MCV: 94 fL (ref 78.0–100.0)
RDW: 12.4 % (ref 11.5–15.5)

## 2012-04-11 LAB — PROTIME-INR
INR: 0.98 (ref 0.00–1.49)
Prothrombin Time: 13.2 seconds (ref 11.6–15.2)

## 2012-04-11 LAB — APTT: aPTT: 30 seconds (ref 24–37)

## 2012-04-17 ENCOUNTER — Encounter: Payer: Self-pay | Admitting: Gastroenterology

## 2012-04-17 MED ORDER — DEXTROSE 5 % IV SOLN
3.0000 g | INTRAVENOUS | Status: AC
  Administered 2012-04-18: 3 g via INTRAVENOUS
  Filled 2012-04-17: qty 3000

## 2012-04-18 ENCOUNTER — Inpatient Hospital Stay (HOSPITAL_COMMUNITY)
Admission: RE | Admit: 2012-04-18 | Discharge: 2012-04-22 | DRG: 470 | Disposition: A | Discharge status: Home Health | Payer: Medicare Other | Source: Ambulatory Visit | Attending: Orthopedic Surgery | Admitting: Orthopedic Surgery

## 2012-04-18 ENCOUNTER — Encounter (HOSPITAL_COMMUNITY)
Admission: RE | Disposition: A | Discharge status: Home Health | Payer: Self-pay | Source: Ambulatory Visit | Attending: Orthopedic Surgery

## 2012-04-18 ENCOUNTER — Encounter (HOSPITAL_COMMUNITY): Payer: Self-pay | Admitting: Anesthesiology

## 2012-04-18 ENCOUNTER — Other Ambulatory Visit: Payer: Self-pay | Admitting: Orthopedic Surgery

## 2012-04-18 ENCOUNTER — Encounter (HOSPITAL_COMMUNITY): Payer: Self-pay | Admitting: *Deleted

## 2012-04-18 ENCOUNTER — Inpatient Hospital Stay (HOSPITAL_COMMUNITY): Payer: Medicare Other | Admitting: Anesthesiology

## 2012-04-18 DIAGNOSIS — Z96649 Presence of unspecified artificial hip joint: Secondary | ICD-10-CM

## 2012-04-18 DIAGNOSIS — M1710 Unilateral primary osteoarthritis, unspecified knee: Principal | ICD-10-CM | POA: Diagnosis present

## 2012-04-18 DIAGNOSIS — I10 Essential (primary) hypertension: Secondary | ICD-10-CM | POA: Diagnosis present

## 2012-04-18 DIAGNOSIS — Z01812 Encounter for preprocedural laboratory examination: Secondary | ICD-10-CM

## 2012-04-18 DIAGNOSIS — G8918 Other acute postprocedural pain: Secondary | ICD-10-CM

## 2012-04-18 DIAGNOSIS — Z7901 Long term (current) use of anticoagulants: Secondary | ICD-10-CM

## 2012-04-18 HISTORY — PX: TOTAL KNEE ARTHROPLASTY: SHX125

## 2012-04-18 SURGERY — ARTHROPLASTY, KNEE, TOTAL
Anesthesia: General | Site: Knee | Laterality: Right | Wound class: Clean

## 2012-04-18 MED ORDER — WARFARIN VIDEO: Freq: Once | Status: DC

## 2012-04-18 MED ORDER — HYDROMORPHONE HCL PF 1 MG/ML IJ SOLN
0.2500 mg | INTRAMUSCULAR | Status: DC | PRN
  Administered 2012-04-18 (×4): 0.5 mg via INTRAVENOUS

## 2012-04-18 MED ORDER — FUROSEMIDE 20 MG PO TABS
20.0000 mg | ORAL_TABLET | Freq: Every day | ORAL | Status: DC
  Administered 2012-04-19 – 2012-04-22 (×4): 20 mg via ORAL
  Filled 2012-04-18 (×5): qty 1

## 2012-04-18 MED ORDER — GLYCOPYRROLATE 0.2 MG/ML IJ SOLN
INTRAMUSCULAR | Status: DC | PRN
  Administered 2012-04-18: .8 mg via INTRAVENOUS

## 2012-04-18 MED ORDER — METOCLOPRAMIDE HCL 10 MG PO TABS: 5.0000 mg | ORAL_TABLET | Freq: Three times a day (TID) | ORAL | Status: DC | PRN

## 2012-04-18 MED ORDER — DEXTROSE-NACL 5-0.45 % IV SOLN
INTRAVENOUS | Status: DC
  Administered 2012-04-18 – 2012-04-19 (×2): via INTRAVENOUS

## 2012-04-18 MED ORDER — DIPHENHYDRAMINE HCL 12.5 MG/5ML PO ELIX: 12.5000 mg | ORAL_SOLUTION | Freq: Four times a day (QID) | ORAL | Status: DC | PRN

## 2012-04-18 MED ORDER — ONDANSETRON HCL 4 MG/2ML IJ SOLN: 4.0000 mg | Freq: Four times a day (QID) | INTRAMUSCULAR | Status: DC | PRN

## 2012-04-18 MED ORDER — CHLORHEXIDINE GLUCONATE 4 % EX LIQD: 60.0000 mL | Freq: Once | CUTANEOUS | Status: DC

## 2012-04-18 MED ORDER — LABETALOL HCL 5 MG/ML IV SOLN
INTRAVENOUS | Status: DC | PRN
  Administered 2012-04-18 (×4): 5 mg via INTRAVENOUS

## 2012-04-18 MED ORDER — HYDROMORPHONE 0.3 MG/ML IV SOLN
INTRAVENOUS | Status: AC
  Filled 2012-04-18: qty 25

## 2012-04-18 MED ORDER — METHOCARBAMOL 500 MG PO TABS
500.0000 mg | ORAL_TABLET | Freq: Four times a day (QID) | ORAL | Status: DC | PRN
  Administered 2012-04-18 – 2012-04-21 (×7): 500 mg via ORAL
  Filled 2012-04-18 (×8): qty 1

## 2012-04-18 MED ORDER — MIDAZOLAM HCL 2 MG/2ML IJ SOLN
INTRAMUSCULAR | Status: AC
  Filled 2012-04-18: qty 2

## 2012-04-18 MED ORDER — NEOSTIGMINE METHYLSULFATE 1 MG/ML IJ SOLN
INTRAMUSCULAR | Status: DC | PRN
  Administered 2012-04-18: 5 mg via INTRAVENOUS

## 2012-04-18 MED ORDER — DOCUSATE SODIUM 100 MG PO CAPS
100.0000 mg | ORAL_CAPSULE | Freq: Two times a day (BID) | ORAL | Status: DC
  Administered 2012-04-18 – 2012-04-22 (×8): 100 mg via ORAL
  Filled 2012-04-18 (×9): qty 1

## 2012-04-18 MED ORDER — BUPIVACAINE-EPINEPHRINE PF 0.5-1:200000 % IJ SOLN
INTRAMUSCULAR | Status: DC | PRN
  Administered 2012-04-18: 30 mL

## 2012-04-18 MED ORDER — ACETAMINOPHEN 650 MG RE SUPP: 650.0000 mg | Freq: Four times a day (QID) | RECTAL | Status: DC | PRN

## 2012-04-18 MED ORDER — CEFAZOLIN SODIUM-DEXTROSE 2-3 GM-% IV SOLR
2.0000 g | Freq: Four times a day (QID) | INTRAVENOUS | Status: AC
  Administered 2012-04-18 (×2): 2 g via INTRAVENOUS
  Filled 2012-04-18 (×2): qty 50

## 2012-04-18 MED ORDER — PROPOFOL 10 MG/ML IV EMUL
INTRAVENOUS | Status: DC | PRN
  Administered 2012-04-18: 200 mg via INTRAVENOUS

## 2012-04-18 MED ORDER — ONDANSETRON HCL 4 MG PO TABS: 4.0000 mg | ORAL_TABLET | Freq: Four times a day (QID) | ORAL | Status: DC | PRN

## 2012-04-18 MED ORDER — MENTHOL 3 MG MT LOZG: 1.0000 | LOZENGE | OROMUCOSAL | Status: DC | PRN

## 2012-04-18 MED ORDER — PHENOL 1.4 % MT LIQD: 1.0000 | OROMUCOSAL | Status: DC | PRN

## 2012-04-18 MED ORDER — LACTATED RINGERS IV SOLN
INTRAVENOUS | Status: DC | PRN
  Administered 2012-04-18 (×3): via INTRAVENOUS

## 2012-04-18 MED ORDER — ACETAMINOPHEN 10 MG/ML IV SOLN
INTRAVENOUS | Status: DC | PRN
  Administered 2012-04-18: 1000 mg via INTRAVENOUS

## 2012-04-18 MED ORDER — FENTANYL CITRATE 0.05 MG/ML IJ SOLN
INTRAMUSCULAR | Status: AC
  Filled 2012-04-18: qty 2

## 2012-04-18 MED ORDER — ROCURONIUM BROMIDE 100 MG/10ML IV SOLN
INTRAVENOUS | Status: DC | PRN
  Administered 2012-04-18: 50 mg via INTRAVENOUS

## 2012-04-18 MED ORDER — HYDROMORPHONE HCL PF 1 MG/ML IJ SOLN
INTRAMUSCULAR | Status: AC
  Filled 2012-04-18: qty 1

## 2012-04-18 MED ORDER — WARFARIN - PHARMACIST DOSING INPATIENT: Freq: Every day | Status: DC

## 2012-04-18 MED ORDER — METOCLOPRAMIDE HCL 5 MG/ML IJ SOLN: 5.0000 mg | Freq: Three times a day (TID) | INTRAMUSCULAR | Status: DC | PRN

## 2012-04-18 MED ORDER — ONDANSETRON HCL 4 MG/2ML IJ SOLN: 4.0000 mg | Freq: Once | INTRAMUSCULAR | Status: DC | PRN

## 2012-04-18 MED ORDER — HYDROMORPHONE 0.3 MG/ML IV SOLN
INTRAVENOUS | Status: DC
  Administered 2012-04-18: 0.4 mg via INTRAVENOUS
  Administered 2012-04-18: 0.199 mg via INTRAVENOUS
  Administered 2012-04-18 – 2012-04-19 (×2): via INTRAVENOUS
  Administered 2012-04-19: 1.23 mg via INTRAVENOUS
  Administered 2012-04-19: 0.6 mg via INTRAVENOUS
  Administered 2012-04-19: 1.19 mg via INTRAVENOUS
  Administered 2012-04-19: 0.399 mg via INTRAVENOUS
  Filled 2012-04-18: qty 25

## 2012-04-18 MED ORDER — FENTANYL CITRATE 0.05 MG/ML IJ SOLN
INTRAMUSCULAR | Status: DC | PRN
  Administered 2012-04-18 (×2): 50 ug via INTRAVENOUS
  Administered 2012-04-18: 100 ug via INTRAVENOUS
  Administered 2012-04-18: 50 ug via INTRAVENOUS
  Administered 2012-04-18: 100 ug via INTRAVENOUS
  Administered 2012-04-18: 50 ug via INTRAVENOUS

## 2012-04-18 MED ORDER — ACETAMINOPHEN 325 MG PO TABS: 650.0000 mg | ORAL_TABLET | Freq: Four times a day (QID) | ORAL | Status: DC | PRN

## 2012-04-18 MED ORDER — ONDANSETRON HCL 4 MG/2ML IJ SOLN
INTRAMUSCULAR | Status: DC | PRN
  Administered 2012-04-18: 4 mg via INTRAVENOUS

## 2012-04-18 MED ORDER — SODIUM CHLORIDE 0.9 % IJ SOLN: 9.0000 mL | INTRAMUSCULAR | Status: DC | PRN

## 2012-04-18 MED ORDER — ACETAMINOPHEN 10 MG/ML IV SOLN
INTRAVENOUS | Status: AC
  Filled 2012-04-18: qty 100

## 2012-04-18 MED ORDER — DIPHENHYDRAMINE HCL 50 MG/ML IJ SOLN: 12.5000 mg | Freq: Four times a day (QID) | INTRAMUSCULAR | Status: DC | PRN

## 2012-04-18 MED ORDER — LIDOCAINE HCL (CARDIAC) 20 MG/ML IV SOLN
INTRAVENOUS | Status: DC | PRN
  Administered 2012-04-18: 100 mg via INTRAVENOUS

## 2012-04-18 MED ORDER — COUMADIN BOOK
Freq: Once | Status: AC
  Administered 2012-04-18: 13:00:00
  Filled 2012-04-18: qty 1

## 2012-04-18 MED ORDER — WARFARIN SODIUM 7.5 MG PO TABS
7.5000 mg | ORAL_TABLET | Freq: Once | ORAL | Status: AC
  Administered 2012-04-18: 7.5 mg via ORAL
  Filled 2012-04-18: qty 1

## 2012-04-18 MED ORDER — FERROUS SULFATE 325 (65 FE) MG PO TABS
325.0000 mg | ORAL_TABLET | Freq: Three times a day (TID) | ORAL | Status: DC
  Administered 2012-04-18 – 2012-04-22 (×11): 325 mg via ORAL
  Filled 2012-04-18 (×15): qty 1

## 2012-04-18 MED ORDER — NALOXONE HCL 0.4 MG/ML IJ SOLN: 0.4000 mg | INTRAMUSCULAR | Status: DC | PRN

## 2012-04-18 MED ORDER — SODIUM CHLORIDE 0.9 % IR SOLN
Status: DC | PRN
  Administered 2012-04-18: 1000 mL

## 2012-04-18 MED ORDER — METHOCARBAMOL 100 MG/ML IJ SOLN
500.0000 mg | Freq: Four times a day (QID) | INTRAVENOUS | Status: DC | PRN
  Administered 2012-04-18 – 2012-04-20 (×2): 500 mg via INTRAVENOUS
  Filled 2012-04-18 (×2): qty 5

## 2012-04-18 SURGICAL SUPPLY — 53 items
BANDAGE ELASTIC 4 VELCRO ST LF (GAUZE/BANDAGES/DRESSINGS) ×2 IMPLANT
BANDAGE ELASTIC 6 VELCRO ST LF (GAUZE/BANDAGES/DRESSINGS) ×1 IMPLANT
BANDAGE ESMARK 6X9 LF (GAUZE/BANDAGES/DRESSINGS) ×1 IMPLANT
BANDAGE GAUZE ELAST BULKY 4 IN (GAUZE/BANDAGES/DRESSINGS) ×2 IMPLANT
BLADE SAGITTAL 25.0X1.27X90 (BLADE) ×2 IMPLANT
BNDG CMPR 9X6 STRL LF SNTH (GAUZE/BANDAGES/DRESSINGS) ×1
BNDG CMPR MED 10X6 ELC LF (GAUZE/BANDAGES/DRESSINGS) ×1
BNDG ELASTIC 6X10 VLCR STRL LF (GAUZE/BANDAGES/DRESSINGS) ×2 IMPLANT
BNDG ESMARK 6X9 LF (GAUZE/BANDAGES/DRESSINGS) ×2
BONE CEMENT PALACOSE (Orthopedic Implant) ×4 IMPLANT
BOWL SMART MIX CTS (DISPOSABLE) ×2 IMPLANT
CEMENT BONE PALACOSE (Orthopedic Implant) ×2 IMPLANT
CLOTH BEACON ORANGE TIMEOUT ST (SAFETY) ×2 IMPLANT
COVER SURGICAL LIGHT HANDLE (MISCELLANEOUS) ×2 IMPLANT
CUFF TOURNIQUET SINGLE 34IN LL (TOURNIQUET CUFF) ×2 IMPLANT
CUFF TOURNIQUET SINGLE 44IN (TOURNIQUET CUFF) IMPLANT
DRAPE EXTREMITY T 121X128X90 (DRAPE) ×2 IMPLANT
DRAPE U-SHAPE 47X51 STRL (DRAPES) ×2 IMPLANT
DRSG ADAPTIC 3X8 NADH LF (GAUZE/BANDAGES/DRESSINGS) ×2 IMPLANT
DRSG PAD ABDOMINAL 8X10 ST (GAUZE/BANDAGES/DRESSINGS) ×2 IMPLANT
DURAPREP 26ML APPLICATOR (WOUND CARE) ×2 IMPLANT
ELECT REM PT RETURN 9FT ADLT (ELECTROSURGICAL) ×2
ELECTRODE REM PT RTRN 9FT ADLT (ELECTROSURGICAL) ×1 IMPLANT
EVACUATOR 3/16  PVC DRAIN (DRAIN)
EVACUATOR 3/16 PVC DRAIN (DRAIN) IMPLANT
FACESHIELD LNG OPTICON STERILE (SAFETY) ×2 IMPLANT
GLOVE SS PI 9.0 STRL (GLOVE) ×2 IMPLANT
GOWN PREVENTION PLUS XLARGE (GOWN DISPOSABLE) ×2 IMPLANT
GOWN STRL NON-REIN LRG LVL3 (GOWN DISPOSABLE) ×6 IMPLANT
HANDPIECE INTERPULSE COAX TIP (DISPOSABLE) ×2
IMMOBILIZER KNEE 20 (SOFTGOODS)
IMMOBILIZER KNEE 20 THIGH 36 (SOFTGOODS) IMPLANT
IMMOBILIZER KNEE 22 UNIV (SOFTGOODS) ×2 IMPLANT
IMMOBILIZER KNEE 24 THIGH 36 (MISCELLANEOUS) IMPLANT
IMMOBILIZER KNEE 24 UNIV (MISCELLANEOUS)
KIT BASIN OR (CUSTOM PROCEDURE TRAY) ×2 IMPLANT
KIT ROOM TURNOVER OR (KITS) ×2 IMPLANT
MANIFOLD NEPTUNE II (INSTRUMENTS) ×2 IMPLANT
NS IRRIG 1000ML POUR BTL (IV SOLUTION) ×2 IMPLANT
PACK TOTAL JOINT (CUSTOM PROCEDURE TRAY) ×2 IMPLANT
PAD ARMBOARD 7.5X6 YLW CONV (MISCELLANEOUS) ×4 IMPLANT
PAD CAST 4YDX4 CTTN HI CHSV (CAST SUPPLIES) IMPLANT
PADDING CAST COTTON 4X4 STRL (CAST SUPPLIES) ×2
PADDING CAST COTTON 6X4 STRL (CAST SUPPLIES) ×2 IMPLANT
SET HNDPC FAN SPRY TIP SCT (DISPOSABLE) ×1 IMPLANT
SPONGE GAUZE 4X4 12PLY (GAUZE/BANDAGES/DRESSINGS) ×2 IMPLANT
STAPLER VISISTAT 35W (STAPLE) ×2 IMPLANT
SUT VIC AB 0 CTB1 27 (SUTURE) ×8 IMPLANT
SUT VIC AB 2-0 CTB1 (SUTURE) ×8 IMPLANT
TOWEL OR 17X24 6PK STRL BLUE (TOWEL DISPOSABLE) ×2 IMPLANT
TOWEL OR 17X26 10 PK STRL BLUE (TOWEL DISPOSABLE) ×2 IMPLANT
TRAY FOLEY CATH 14FR (SET/KITS/TRAYS/PACK) ×2 IMPLANT
WATER STERILE IRR 1000ML POUR (IV SOLUTION) ×6 IMPLANT

## 2012-04-18 NOTE — Brief Op Note (Signed)
04/18/2012  10:38 AM  PATIENT:  Anne Ng  61 y.o. male  PRE-OPERATIVE DIAGNOSIS:  djd right knee  POST-OPERATIVE DIAGNOSIS:  djd right knee  PROCEDURE:  Procedure(s) (LRB) with comments: TOTAL KNEE ARTHROPLASTY (Right)  SURGEON:  Surgeon(s) and Role:    * Kennieth Rad, MD - Primary  PHYSICIAN ASSISTANT:   ASSISTANTS: none   ANESTHESIA:   general  EBL:  Total I/O In: 2000 [I.V.:2000] Out: 700 [Urine:700]  BLOOD ADMINISTERED:none  DRAINS: none   LOCAL MEDICATIONS USED:  NONE  SPECIMEN:  No Specimen  DISPOSITION OF SPECIMEN:  N/A  COUNTS:  YES  TOURNIQUET:   Total Tourniquet Time Documented: Thigh (Right) - 70 minutes  DICTATION: .Other Dictation: Dictation Number report #[id number]  PLAN OF CARE: Admit to inpatient   PATIENT DISPOSITION:  PACU - hemodynamically stable.   Delay start of Pharmacological VTE agent (>24hrs) due to surgical blood loss or risk of bleeding: yes

## 2012-04-18 NOTE — Anesthesia Postprocedure Evaluation (Signed)
Anesthesia Post Note  Patient: Jeffery Price  Procedure(s) Performed: Procedure(s) (LRB): TOTAL KNEE ARTHROPLASTY (Right)  Anesthesia type: General  Patient location: PACU  Post pain: Pain level controlled and Adequate analgesia  Post assessment: Post-op Vital signs reviewed, Patient's Cardiovascular Status Stable, Respiratory Function Stable, Patent Airway and Pain level controlled  Last Vitals:  Filed Vitals:   04/18/12 1055  BP:   Pulse: 75  Temp:   Resp: 16    Post vital signs: Reviewed and stable  Level of consciousness: awake, alert  and oriented  Complications: No apparent anesthesia complications

## 2012-04-18 NOTE — Progress Notes (Signed)
UR COMPLETED  

## 2012-04-18 NOTE — Anesthesia Procedure Notes (Addendum)
Anesthesia Regional Block:  Femoral nerve block  Pre-Anesthetic Checklist: ,, timeout performed, Correct Patient, Correct Site, Correct Laterality, Correct Procedure, Correct Position, site marked, Risks and benefits discussed,  Surgical consent,  Pre-op evaluation,  At surgeon's request and post-op pain management  Laterality: Right and Lower  Prep: chloraprep       Needles:  Injection technique: Single-shot  Needle Type: Echogenic Needle     Needle Length: 9cm  Needle Gauge: 21    Additional Needles:  Procedures: ultrasound guided Femoral nerve block Narrative:  Start time: 04/18/2012 7:10 AM End time: 04/18/2012 7:18 AM Injection made incrementally with aspirations every 5 mL.  Performed by: Personally  Anesthesiologist: Sheldon Silvan, MD  Additional Notes: Marcaine 0.5% with EPI 1:200000  Femoral nerve block Procedure Name: Intubation Date/Time: 04/18/2012 8:07 AM Performed by: Cathie Olden B Pre-anesthesia Checklist: Patient identified, Emergency Drugs available, Suction available, Patient being monitored and Timeout performed Patient Re-evaluated:Patient Re-evaluated prior to inductionOxygen Delivery Method: Circle system utilized Preoxygenation: Pre-oxygenation with 100% oxygen Intubation Type: IV induction Laryngoscope Size: Mac and 3 Grade View: Grade I Tube type: Oral Tube size: 7.5 mm Airway Equipment and Method: Stylet Placement Confirmation: ETT inserted through vocal cords under direct vision,  positive ETCO2 and breath sounds checked- equal and bilateral Secured at: 23 cm Tube secured with: Tape Dental Injury: Teeth and Oropharynx as per pre-operative assessment

## 2012-04-18 NOTE — Anesthesia Preprocedure Evaluation (Addendum)
Anesthesia Evaluation  Patient identified by MRN, date of birth, ID band Patient awake and Patient confused    Reviewed: Allergy & Precautions, H&P , NPO status , Patient's Chart, lab work & pertinent test results  Airway Mallampati: I TM Distance: >3 FB Neck ROM: Full    Dental  (+) Edentulous Upper and Edentulous Lower   Pulmonary  breath sounds clear to auscultation        Cardiovascular hypertension, Pt. on medications Rhythm:Regular Rate:Normal     Neuro/Psych    GI/Hepatic   Endo/Other    Renal/GU      Musculoskeletal   Abdominal   Peds  Hematology   Anesthesia Other Findings   Reproductive/Obstetrics                           Anesthesia Physical Anesthesia Plan  ASA: II  Anesthesia Plan: General and Regional   Post-op Pain Management: MAC Combined w/ Regional for Post-op pain   Induction: Intravenous  Airway Management Planned: Oral ETT  Additional Equipment:   Intra-op Plan:   Post-operative Plan: Extubation in OR  Informed Consent: I have reviewed the patients History and Physical, chart, labs and discussed the procedure including the risks, benefits and alternatives for the proposed anesthesia with the patient or authorized representative who has indicated his/her understanding and acceptance.   Dental advisory given  Plan Discussed with: CRNA, Anesthesiologist and Surgeon  Anesthesia Plan Comments:        Anesthesia Quick Evaluation

## 2012-04-18 NOTE — Preoperative (Signed)
Beta Blockers   Reason not to administer Beta Blockers:Not Applicable 

## 2012-04-18 NOTE — Progress Notes (Signed)
Orthopedic Tech Progress Note Patient Details:  Jeffery Price 1951/04/24 [id number] CPM applied to Right LE with appropriate settings; OHF with trapeze applied to bed in PACU CPM Right Knee CPM Right Knee: On Right Knee Flexion (Degrees): 60  Right Knee Extension (Degrees): 0    [name] 04/18/2012, 11:23 AM

## 2012-04-18 NOTE — Transfer of Care (Signed)
Immediate Anesthesia Transfer of Care Note  Patient: Jeffery Price  Procedure(s) Performed: Procedure(s) (LRB) with comments: TOTAL KNEE ARTHROPLASTY (Right)  Patient Location: PACU  Anesthesia Type: General  Level of Consciousness: awake and patient cooperative  Airway & Oxygen Therapy: Patient Spontanous Breathing and Patient connected to face mask oxygen  Post-op Assessment: Report given to PACU RN, Post -op Vital signs reviewed and stable and Patient moving all extremities X 4  Post vital signs: Reviewed and stable  Complications: No apparent anesthesia complications

## 2012-04-18 NOTE — H&P (Signed)
Jeffery Price is an 61 y.o. male.   Chief Complaint: painful ,swelling of right knee HPI: THIS IS A 61Y/O MALE TREATED FOR -DJD OF THE RIGHT KNEE WITH PROGRESSIVE WORSENING OF PAIN ,SWELLING, AND LOSS OF FUNCTION.  Past Medical History  Diagnosis Date  . Hypertension     not taking midication due to expense  . Arthritis     osteoarthritis  . Swelling of joint, knee, right     Past Surgical History  Procedure Date  . Knee ligament reconstruction 1969    left knee  . Colonoscopy   . Knee arthroscopy 01/04/2012    Procedure: ARTHROSCOPY KNEE;  Surgeon: Kennieth Rad, MD;  Location: [facility] OR;  Service: Orthopedics;  Laterality: Right;  . Joint replacement 10-,2012    left hip    Family History  Problem Relation Age of Onset  . Anesthesia problems Neg Hx    Social History:  reports that he has quit smoking. His smoking use included Cigarettes. He has a 5 pack-year smoking history. He has never used smokeless tobacco. He reports that he drinks alcohol. He reports that he does not use illicit drugs.  Allergies: No Known Allergies  Medications Prior to Admission  Medication Sig Dispense Refill  . etodolac (LODINE) 400 MG tablet Take 400 mg by mouth 2 (two) times daily.      . furosemide (LASIX) 20 MG tablet Take 20 mg by mouth daily.      Marland Kitchen HYDROcodone-acetaminophen (LORCET) 10-650 MG per tablet Take 1 tablet by mouth every 8 (eight) hours as needed. For pain      . UNKNOWN TO PATIENT Other BP medication unsure name gets from Dr. Loleta Chance does not get from pharmacy        No results found for this or any previous visit (from the past 48 hour(s)). No results found.  Review of Systems  Constitutional: Negative.   HENT: Negative.   Eyes: Negative.   Respiratory: Negative.   Cardiovascular: Negative.   Gastrointestinal: Negative.   Genitourinary: Negative.   Musculoskeletal: Positive for joint pain.  Skin: Negative.   Neurological: Negative.   Endo/Heme/Allergies: Negative.     Psychiatric/Behavioral: Negative.     Blood pressure 135/82, pulse 77, temperature 97.7 F (36.5 C), temperature source Oral, resp. rate 20, SpO2 100.00%. Physical Exam RIGHT KNEE WITH PLUS 3 EFFUSION, TENDER, CREPITUS BOTH MEDIAL AND LATERAL COMPARTMENTS. LIMITED ROM.  Assessment/Plan DJD RIGHT KNEE/ PLAN RIGHT TKA  [name] F 04/18/2012, 7:57 AM

## 2012-04-18 NOTE — Progress Notes (Signed)
ANTICOAGULATION CONSULT NOTE - Initial Consult  Pharmacy Consult for Coumadin Indication: VTE prophylaxis  No Known Allergies  Patient Measurements: 85.1 kg  Vital Signs: Temp: 97.8 F (36.6 C) (09/05 1218) Temp src: Oral (09/05 0618) BP: 131/67 mmHg (09/05 1212) Pulse Rate: 85  (09/05 1218)  Labs: No results found for this basename: HGB:2,HCT:3,PLT:3,APTT:3,LABPROT:3,INR:3,HEPARINUNFRC:3,CREATININE:3,CKTOTAL:3,CKMB:3,TROPONINI:3 in the last 72 hours  The CrCl is unknown because both a height and weight (above a minimum accepted value) are required for this calculation.   Medical History: Past Medical History  Diagnosis Date  . Hypertension     not taking midication due to expense  . Arthritis     osteoarthritis  . Swelling of joint, knee, right     Medications:  Prescriptions prior to admission  Medication Sig Dispense Refill  . etodolac (LODINE) 400 MG tablet Take 400 mg by mouth 2 (two) times daily.      . furosemide (LASIX) 20 MG tablet Take 20 mg by mouth daily.      Marland Kitchen HYDROcodone-acetaminophen (LORCET) 10-650 MG per tablet Take 1 tablet by mouth every 8 (eight) hours as needed. For pain      . UNKNOWN TO PATIENT Other BP medication unsure name gets from Dr. 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  Baseline INR 0.98, h/h 14.6/42.3 Goal of Therapy:  INR 2-3 Monitor platelets by anticoagulation protocol: Yes   Plan:  Coumadin 7.5 mg today. Check daily PT/INR.  Talbert Cage Poteet 04/18/2012,1:08 PM

## 2012-04-19 LAB — CBC
HCT: 36.4 % — ABNORMAL LOW (ref 39.0–52.0)
Hemoglobin: 12.7 g/dL — ABNORMAL LOW (ref 13.0–17.0)
MCH: 32.6 pg (ref 26.0–34.0)
MCV: 93.6 fL (ref 78.0–100.0)
Platelets: 261 10*3/uL (ref 150–400)
RBC: 3.89 MIL/uL — ABNORMAL LOW (ref 4.22–5.81)
WBC: 6.8 10*3/uL (ref 4.0–10.5)

## 2012-04-19 MED ORDER — WARFARIN SODIUM 7.5 MG PO TABS
7.5000 mg | ORAL_TABLET | Freq: Once | ORAL | Status: AC
  Administered 2012-04-19: 7.5 mg via ORAL
  Filled 2012-04-19: qty 1

## 2012-04-19 MED ORDER — OXYCODONE-ACETAMINOPHEN 5-325 MG PO TABS
1.0000 | ORAL_TABLET | ORAL | Status: DC | PRN
  Administered 2012-04-19 – 2012-04-20 (×6): 2 via ORAL
  Administered 2012-04-20 – 2012-04-21 (×3): 1 via ORAL
  Administered 2012-04-21: 2 via ORAL
  Administered 2012-04-21: 1 via ORAL
  Administered 2012-04-21 – 2012-04-22 (×5): 2 via ORAL
  Filled 2012-04-19: qty 1
  Filled 2012-04-19: qty 2
  Filled 2012-04-19: qty 1
  Filled 2012-04-19: qty 2
  Filled 2012-04-19: qty 1
  Filled 2012-04-19 (×10): qty 2
  Filled 2012-04-19: qty 1

## 2012-04-19 MED ORDER — ALUM & MAG HYDROXIDE-SIMETH 200-200-20 MG/5ML PO SUSP
30.0000 mL | ORAL | Status: DC | PRN
  Administered 2012-04-19: 30 mL via ORAL

## 2012-04-19 NOTE — Progress Notes (Signed)
Referral received for SNF. Chart reviewed and CSW has spoken with RNCM who indicates that patient is for DC to home with Home Health and DME.  CSW to sign off. Please re-consult if CSW needs arise.  Starsha Morning T. Feliz Lincoln, LCSWA  [phone]  

## 2012-04-19 NOTE — H&P (Signed)
NAME:  MIRACLE, CRIADO NO.:  [id number]  MEDICAL RECORD NO.:  [id number]  LOCATION:  [id number]                        FACILITY:  [facility]  PHYSICIAN:  Myrtie Neither, MD      DATE OF BIRTH:  Oct 25, 1950  DATE OF ADMISSION:  04/18/2012 DATE OF DISCHARGE:                             HISTORY & PHYSICAL   CHIEF COMPLAINT:  Recurrent pain, swelling, giving way of the right knee.  HISTORY OF PRESENT ILLNESS:  This is a 61 year old male being treated for initially internal derangement of the right knee, which progressively worsened to degenerative joint disease with recurrent +3 effusion, weakness, limited range of motion, and progressive loss of function.  The patient was treated with anti-inflammatories, therapeutic injections, and pain medications without positive response.  PAST MEDICAL HISTORY:  Hypertension, degenerative joint disease.  SURGERY:  Left knee reconstruction surgery, colonoscopy, the patient has had left total knee arthroplasty, arthroscopy of the right knee.  ALLERGIES:  None known.  SOCIAL HISTORY:  The patient smokes less than a half a pack a day. Denies use of alcohol or illegal drugs.  FAMILY HISTORY:  High blood pressure.  MEDICATIONS: 1. Lodine 400 b.i.d. 2. Lasix 20 mg. 3. Lorcet 10/650 q.8 p.r.n. 4. Antihypertensive medication.  REVIEW OF SYSTEMS:  Basically as in history of present illness.  No cardiac, respiratory, urinary, or bowel symptoms.  PHYSICAL EXAMINATION:  GENERAL:  Alert and oriented.  No acute distress. VITAL SIGNS:  Temperature 97.7, pulse 77, respirations 20, blood pressure 135/82, O2 saturation 100%, height 5 feet 8 inches, weight 85.14 kg. HEAD:  Normocephalic. EYES:  Conjunctivae and sclerae clear. NECK:  Supple. CHEST:  Clear. CARDIAC:  S1 and S2.  Regular. EXTREMITIES:  Right knee +3 effusion, crepitus both medial and lateral compartment and patellofemoral joint.  Limited range of motion. Negative Homans  test.  X-rays revealed loss of joint space, medial compartment, and patellofemoral joint.  IMPRESSION:  Degenerative joint disease, right knee, history of hypertension.  PLAN:  Right total knee arthroplasty.     Myrtie Neither, MD     AC/MEDQ  D:  04/18/2012  T:  04/19/2012  Job:  [id number]

## 2012-04-19 NOTE — Progress Notes (Signed)
Occupational Therapy Evaluation Patient Details Name: Jeffery Price MRN: [id number] DOB: Oct 14, 1950 Today's Date: 04/19/2012 Time: 4098-1191 OT Time Calculation (min): 22 min  OT Assessment / Plan / Recommendation Clinical Impression  61 yo s/p R TKA. PWB 50%. Completed all education regarding ADL AE and DME. Pt has all equiment needed for D/C and is making good progress. Wife will be able to assist as needed after D/C. No further OT needs.    OT Assessment  Patient does not need any further OT services    Follow Up Recommendations  No OT follow up    Barriers to Discharge  none    Equipment Recommendations  None recommended by OT    Recommendations for Other Services    Frequency    eval only   Precautions / Restrictions Precautions Precautions: Knee Required Braces or Orthoses: Knee Immobilizer - Right Restrictions Weight Bearing Restrictions: Yes RLE Weight Bearing: Partial weight bearing RLE Partial Weight Bearing Percentage or Pounds: 50%   Pertinent Vitals/Pain 5    ADL  Lower Body Bathing: Simulated;Minimal assistance Where Assessed - Lower Body Bathing: Unsupported sit to stand Upper Body Dressing: Simulated;Set up Where Assessed - Upper Body Dressing: Unsupported sitting Lower Body Dressing: Simulated;Minimal assistance Where Assessed - Lower Body Dressing: Unsupported sit to stand Toilet Transfer: Simulated;Supervision/safety Toilet Transfer Method: Sit to stand;Stand pivot Acupuncturist: Bedside commode Toileting - Clothing Manipulation and Hygiene: Supervision/safety Equipment Used: Knee Immobilizer;Rolling walker;Gait belt Transfers/Ambulation Related to ADLs: S ADL Comments: Pt has AE from previous hip surgery for ADL.     OT Diagnosis:    OT Problem List:   OT Treatment Interventions:     OT Goals Acute Rehab OT Goals OT Goal Formulation:  (eval only) Time For Goal Achievement: 04/26/12 Potential to Achieve Goals: Good  Visit  Information  Last OT Received On: 04/19/12 Assistance Needed: +1    Subjective Data      Prior Functioning  Vision/Perception  Home Living Lives With: Spouse Available Help at Discharge: Family Type of Home: House Home Access: Stairs to enter Secretary/administrator of Steps: 1 Entrance Stairs-Rails: None Home Layout: Two level;Able to live on main level with bedroom/bathroom Alternate Level Stairs-Number of Steps: 14 Alternate Level Stairs-Rails: Left Bathroom Shower/Tub: Tub/shower unit;Curtain Bathroom Toilet: Standard Bathroom Accessibility: Yes How Accessible: Accessible via walker Home Adaptive Equipment: Bedside commode/3-in-1;Walker - rolling Additional Comments: wife is home during the day until around 6. Prior Function Level of Independence: Independent Able to Take Stairs?: Yes Driving: Yes Vocation: Retired Musician: Other (comment) Dominant Hand: Right      Cognition  Overall Cognitive Status: Appears within functional limits for tasks assessed/performed Arousal/Alertness: Awake/alert Orientation Level: Appears intact for tasks assessed;Oriented X4 / Intact Behavior During Session: [facility] for tasks performed    Extremity/Trunk Assessment Right Upper Extremity Assessment RUE ROM/Strength/Tone: [facility] for tasks assessed Left Upper Extremity Assessment LUE ROM/Strength/Tone: [facility] for tasks assessed   Mobility  Shoulder Instructions  Bed Mobility Bed Mobility: Supine to Sit;Sitting - Scoot to Edge of Bed Supine to Sit: 5: Supervision Sitting - Scoot to Edge of Bed: 5: Supervision Transfers Transfers: Sit to Stand;Stand to Sit Sit to Stand: 5: Supervision;With upper extremity assist;From chair/3-in-1 Stand to Sit: 5: Supervision;With upper extremity assist;To chair/3-in-1 Details for Transfer Assistance: VC's for hand placement initially       Exercise   Balance  WFL   End of Session OT - End of Session Equipment Utilized During  Treatment: Gait belt;Right knee immobilizer  Activity Tolerance: Patient tolerated treatment well Patient left: in chair;with call bell/phone within reach;with family/visitor present Nurse Communication: Mobility status;Precautions  GO     Meosha Castanon,HILLARY 04/19/2012, 2:51 PM [facility], OTR/L  [phone] 04/19/2012

## 2012-04-19 NOTE — Evaluation (Signed)
Physical Therapy Evaluation Patient Details Name: Jeffery Price MRN: [id number] DOB: 10-Jul-1951 Today's Date: 04/19/2012 Time: 4696-2952 PT Time Calculation (min): 34 min  PT Assessment / Plan / Recommendation Clinical Impression  Pt is a pleasent and cooperative 61 y.o. male s/p right TKA. Patient has had prior joint sx (L THA) approx 1 yr prior.  Patient demonstrates deficits in functional mobility secondary to decreased strength, ROM and precautions. Patient will continue to benefit from skilled PT to address these deficitis and maximize independence for discharge.    PT Assessment  Patient needs continued PT services    Follow Up Recommendations  Home health PT    Barriers to Discharge        Equipment Recommendations       Recommendations for Other Services     Frequency 7X/week    Precautions / Restrictions Precautions Precautions: Knee Required Braces or Orthoses: Knee Immobilizer - Right Restrictions Weight Bearing Restrictions: Yes RLE Weight Bearing: Partial weight bearing RLE Partial Weight Bearing Percentage or Pounds: 50%   Pertinent Vitals/Pain 5/10      Mobility  Bed Mobility Bed Mobility: Supine to Sit;Sitting - Scoot to Edge of Bed Supine to Sit: 5: Supervision Sitting - Scoot to Edge of Bed: 5: Supervision Transfers Transfers: Sit to Stand;Stand to Sit Sit to Stand: 4: Min guard;From bed Stand to Sit: 4: Min guard;To chair/3-in-1;With armrests Details for Transfer Assistance: VC's for hand placement initially Ambulation/Gait Ambulation/Gait Assistance: 4: Min guard Ambulation Distance (Feet): 24 Feet Assistive device: Rolling walker Ambulation/Gait Assistance Details: VC's initially for sequencing and PWB  Gait Pattern: Step-to pattern;Decreased stride length;Antalgic Gait velocity: decreased General Gait Details: Patient able to maintain PWB with use of rw and upper extremity Stairs: No    Exercises Total Joint Exercises Ankle  Circles/Pumps: AROM;Both;20 reps Quad Sets: AROM;Strengthening;10 reps;Right Heel Slides: AROM;Other (comment);Right;10 reps (PROM 3 reps for measurement) Goniometric ROM: 12-55 degrees   PT Diagnosis: Difficulty walking;Abnormality of gait;Generalized weakness;Acute pain  PT Problem List: Decreased strength;Decreased range of motion;Decreased mobility;Pain PT Treatment Interventions: DME instruction;Gait training;Functional mobility training;Stair training;Therapeutic activities;Therapeutic exercise;Patient/family education   PT Goals Acute Rehab PT Goals PT Goal Formulation: With patient Time For Goal Achievement: 04/24/12 Potential to Achieve Goals: Good Pt will go Sit to Stand: with modified independence PT Goal: Sit to Stand - Progress: Goal set today Pt will Ambulate: >150 feet;with modified independence;with rolling walker PT Goal: Ambulate - Progress: Goal set today Pt will Go Up / Down Stairs: 1-2 stairs;with modified independence;with rolling walker PT Goal: Up/Down Stairs - Progress: Goal set today Pt will Perform Home Exercise Program: Independently PT Goal: Perform Home Exercise Program - Progress: Goal set today  Visit Information  Last PT Received On: 04/19/12 Assistance Needed: +1    Subjective Data  Subjective: I had a rough night last night; pain  medication had to be changed Patient Stated Goal: to go home   Prior Functioning  Home Living Lives With: Spouse Available Help at Discharge: Family Type of Home: House Home Access: Stairs to enter Secretary/administrator of Steps: 1 Entrance Stairs-Rails: None Home Layout: Two level;Able to live on main level with bedroom/bathroom Alternate Level Stairs-Number of Steps: 14 Alternate Level Stairs-Rails: Left Bathroom Shower/Tub: Tub/shower unit;Curtain Bathroom Toilet: Standard Home Adaptive Equipment: Bedside commode/3-in-1;Walker - rolling Prior Function Level of Independence: Independent Able to Take  Stairs?: Yes Driving: Yes Vocation: Retired Musician: Other (comment) (wears glasses)    Cognition  Overall Cognitive Status: Appears within functional limits for  tasks assessed/performed Arousal/Alertness: Awake/alert Orientation Level: Appears intact for tasks assessed;Oriented X4 / Intact Behavior During Session: [facility] for tasks performed    Extremity/Trunk Assessment Right Upper Extremity Assessment RUE ROM/Strength/Tone: [facility] for tasks assessed Left Upper Extremity Assessment LUE ROM/Strength/Tone: [facility] for tasks assessed Right Lower Extremity Assessment RLE ROM/Strength/Tone: Deficits;Unable to fully assess;Due to pain;Due to precautions Left Lower Extremity Assessment LLE ROM/Strength/Tone: [facility] for tasks assessed Trunk Assessment Trunk Assessment: Normal   Balance    End of Session PT - End of Session Equipment Utilized During Treatment: Gait belt;Right knee immobilizer Activity Tolerance: Patient tolerated treatment well Patient left: in chair;with call bell/phone within reach;with family/visitor present Nurse Communication: Mobility status CPM Right Knee CPM Right Knee: Off  GP     Fabio Asa 04/19/2012, 10:02 AM Charlotte Crumb, PT DPT  [phone]

## 2012-04-19 NOTE — Progress Notes (Signed)
ANTICOAGULATION CONSULT NOTE   Pharmacy Consult for Coumadin Indication: VTE prophylaxis  No Known Allergies  Patient Measurements: 85.1 kg  Vital Signs: Temp: 98.7 F (37.1 C) (09/06 0658) BP: 134/78 mmHg (09/06 0658) Pulse Rate: 85  (09/06 0658)  Labs:  Basename 04/19/12 0648  HGB 12.7*  HCT 36.4*  PLT 261  APTT --  LABPROT 14.1  INR 1.07  HEPARINUNFRC --  CREATININE --  CKTOTAL --  CKMB --  TROPONINI --    Estimated Creatinine Clearance: 71.9 ml/min (by C-G formula based on Cr of 1.11).   Medical History: Past Medical History  Diagnosis Date  . Hypertension     not taking midication due to expense  . Arthritis     osteoarthritis  . Swelling of joint, knee, right     Medications:  Prescriptions prior to admission  Medication Sig Dispense Refill  . etodolac (LODINE) 400 MG tablet Take 400 mg by mouth 2 (two) times daily.      . furosemide (LASIX) 20 MG tablet Take 20 mg by mouth daily.      Marland Kitchen HYDROcodone-acetaminophen (LORCET) 10-650 MG per tablet Take 1 tablet by mouth every 8 (eight) hours as needed. For pain      . UNKNOWN TO PATIENT Other BP medication unsure name gets from Dr. 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  Baseline INR 0.98, h/h 14.6/42.3  INR today 1.07 Goal of Therapy:  INR 2-3 Monitor platelets by anticoagulation protocol: Yes   Plan:  Coumadin 7.5 mg today. Check daily PT/INR.  Talbert Cage Poteet 04/19/2012,9:34 AM

## 2012-04-19 NOTE — Progress Notes (Signed)
Physical Therapy Treatment Patient Details Name: Jeffery Price MRN: [id number] DOB: 03-05-1951 Today's Date: 04/19/2012 Time: 9562-1308 PT Time Calculation (min): 38 min  PT Assessment / Plan / Recommendation Comments on Treatment Session  Pt demonstrates improvements in activity tolerance and ambulation this afternoon. Pt able to progress distance with ambulation.  Pt is making steady progress towards physical therapy goals at this time. Patient will benefit from continued skilled PT to maximize independence for discharge. Pt educated throughout session on the importance of continuous motion and ROM activities.    Follow Up Recommendations  Home health PT    Barriers to Discharge        Equipment Recommendations       Recommendations for Other Services    Frequency 7X/week   Plan Discharge plan remains appropriate    Precautions / Restrictions Precautions Precautions: Knee Required Braces or Orthoses: Knee Immobilizer - Right Restrictions Weight Bearing Restrictions: Yes RLE Weight Bearing: Partial weight bearing RLE Partial Weight Bearing Percentage or Pounds: 50%   Pertinent Vitals/Pain 4/10    Mobility  Bed Mobility Bed Mobility: Supine to Sit;Sitting - Scoot to Edge of Bed Supine to Sit: 5: Supervision Sitting - Scoot to Edge of Bed: 5: Supervision Transfers Transfers: Sit to Stand;Stand to Sit Sit to Stand: 4: Min guard;From bed Stand to Sit: 4: Min guard;To chair/3-in-1;With armrests Details for Transfer Assistance: VC's for hand placement initially Ambulation/Gait Ambulation/Gait Assistance: 4: Min guard Ambulation Distance (Feet): 200 Feet Assistive device: Rolling walker Gait Pattern: Step-to pattern;Decreased stride length;Antalgic Gait velocity: decreased Stairs: No    Exercises Total Joint Exercises Ankle Circles/Pumps: AROM;Both;20 reps;Seated Quad Sets: AROM;Strengthening;10 reps;Right;Seated Long Arc Quad: AROM;Right;10 reps;Seated Knee Flexion:  AROM;Right;10 reps;Seated    PT Goals Acute Rehab PT Goals PT Goal Formulation: With patient Time For Goal Achievement: 04/24/12 Potential to Achieve Goals: Good Pt will go Sit to Stand: with modified independence PT Goal: Sit to Stand - Progress: Progressing toward goal Pt will Ambulate: >150 feet;with modified independence;with rolling walker PT Goal: Ambulate - Progress: Progressing toward goal Pt will Go Up / Down Stairs: 1-2 stairs;with modified independence;with rolling walker Pt will Perform Home Exercise Program: Independently PT Goal: Perform Home Exercise Program - Progress: Progressing toward goal  Visit Information  Last PT Received On: 04/19/12 Assistance Needed: +1    Subjective Data  Subjective: I had a rough night last night; pain  medication had to be changed Patient Stated Goal: to go home   Cognition  Overall Cognitive Status: Appears within functional limits for tasks assessed/performed Arousal/Alertness: Awake/alert Orientation Level: Appears intact for tasks assessed;Oriented X4 / Intact Behavior During Session: [facility] for tasks performed    Balance     End of Session PT - End of Session Equipment Utilized During Treatment: Gait belt;Right knee immobilizer Activity Tolerance: Patient tolerated treatment well Patient left: in chair;with call bell/phone within reach;with family/visitor present Nurse Communication: Mobility status   GP     Fabio Asa 04/19/2012, 2:22 PM Charlotte Crumb, PT DPT  [phone]

## 2012-04-19 NOTE — Op Note (Signed)
NAME:  Jeffery Price, Jeffery Price NO.:  [id number]  MEDICAL RECORD NO.:  [id number]  LOCATION:  [id number]                        FACILITY:  [facility]  PHYSICIAN:  Myrtie Neither, MD      DATE OF BIRTH:  09-02-1950  DATE OF PROCEDURE:  04/18/2012 DATE OF DISCHARGE:                              OPERATIVE REPORT   PREOPERATIVE DIAGNOSIS:  Degenerative joint arthropathy, right knee.  POSTOPERATIVE DIAGNOSIS:  Degenerative joint arthropathy right knee.  ANESTHESIA:  General.  PROCEDURE:  Right total knee arthroplasty, Biomet implant.  DESCRIPTION OF PROCEDURE:  The patient was taken to the operating room. After given adequate preop medications, given general anesthesia and intubated.  Right lower extremity was prepped with DuraPrep and draped in a sterile manner.  Tourniquet and Bovie used for hemostasis.  An anterior midline incision made over the right knee from the tibial tuberosity up to the quadriceps.  Soft tissue dissection was made of both medial and laterally.  A paramedian incision was made into the capsule extending from the quadriceps down to the tibial tuberosity. The patient had significant varus deformity.  Soft tissue release medially and soft tissue resection was then done.  After soft tissue release was done medially and resection, the knee was able to be brought out into a neutral to valgus position.  With the knee in a flexed position, osteophytes about the tibia, patella, and femur were resected. After adequate soft tissue resection, tibial plateau cutting jig was put in place and 10 mm of tibial surface was resected.  Reaming down the femoral canal was then done.  Distal femoral cutting jig was put in place and distal femoral cut was made.  Measurement of the femur was that of 67.5, and appropriate cutting jig was put in place.  Anterior, posterior, and chamfering cuts were made.  After adequate soft tissue resection about the femur, trial component was put in  place and was found to fit very snugly.  Next, attention was then turned to the tibial plateau surface, which was measured at 75 mm.  Appropriate cutting jig was put in place and tibial surface was prepared.  The trial femoral components as well as the trial tibial components with a 10-mm poly was put in place.  There was full extension, full flexion, good medial and lateral stability.  Attention was then turned to the patella.  This was sized at medium 34 mm patella, but thin and appropriate cutting jig for the patellar resurfacing was done.  With all 3 components in place, the knee was taken into full flexion, full extension, good medial and lateral stability.  No telescoping of the patella.  No subluxing of the patella and good tracking.  All 3 components were then removed. Irrigation with Simpulse irrigator was then done.  Methylmethacrylate was then mixed.  The patella and tibial components were cemented.  The femoral component was press fitted.  After excess methylmethacrylate was removed and setting of the cement was done, trial poly was then put back in place and again 10 mm was found to be stable with full flexion, full extension, and good medial and lateral stability and no subluxing of the patella.  Final poly was then  put in place and locked in place.  Wound closure was then done with 0-Vicryl for the fascia, 2-0 for the subcutaneous, and skin staples.  Tourniquet was let down.  Compressive dressing was applied and knee immobilizer applied.  The patient tolerated the procedure quite well, went to recovery room in stable and satisfactory condition.  Final components that we used was that of a tibial interlock 75 mm plate, 62.1 right femoral press-fit component.  Then 34 x 7.8 mm patellar button single closed then 10 mm x 71 to 75 mm tibial bearing surface.  The patient tolerated the procedure quite well, went to recovery room in stable and satisfactory  condition.     Myrtie Neither, MD     AC/MEDQ  D:  04/18/2012  T:  04/19/2012  Job:  [id number]

## 2012-04-19 NOTE — Progress Notes (Signed)
Subjective: 1 Day Post-Op Procedure(s) (LRB): TOTAL KNEE ARTHROPLASTY (Right) Patient reports pain as 4 on 0-10 scale.    Objective: Vital signs in last 24 hours: Temp:  [97.1 F (36.2 C)-98.7 F (37.1 C)] 98.7 F (37.1 C) (09/06 0658) Pulse Rate:  [66-91] 85  (09/06 0658) Resp:  [11-19] 12  (09/06 0813) BP: (127-148)/(67-81) 134/78 mmHg (09/06 0658) SpO2:  [96 %-100 %] 100 % (09/06 0813) Weight:  [83.915 kg (185 lb)] 83.915 kg (185 lb) (09/05 1300)  Intake/Output from previous day: 09/05 0701 - 09/06 0700 In: 3610 [P.O.:510; I.V.:3100] Out: 2875 [Urine:2875] Intake/Output this shift:     Basename 04/19/12 0648  HGB 12.7*    Basename 04/19/12 0648  WBC 6.8  RBC 3.89*  HCT 36.4*  PLT 261   No results found for this basename: NA:2,K:2,CL:2,CO2:2,BUN:2,CREATININE:2,GLUCOSE:2,CALCIUM:2 in the last 72 hours  Basename 04/19/12 0648  LABPT --  INR 1.07    Neurovascular intact Sensation intact distally  Assessment/Plan: 1 Day Post-Op Procedure(s) (LRB): TOTAL KNEE ARTHROPLASTY (Right) Up with therapy  Mir Fullilove F 04/19/2012, 9:57 AM

## 2012-04-20 LAB — CBC
Hemoglobin: 10.4 g/dL — ABNORMAL LOW (ref 13.0–17.0)
MCV: 93.6 fL (ref 78.0–100.0)
Platelets: 204 10*3/uL (ref 150–400)
RBC: 3.13 MIL/uL — ABNORMAL LOW (ref 4.22–5.81)
WBC: 6.1 10*3/uL (ref 4.0–10.5)

## 2012-04-20 MED ORDER — WARFARIN SODIUM 1 MG PO TABS
1.0000 mg | ORAL_TABLET | Freq: Once | ORAL | Status: AC
  Administered 2012-04-20: 1 mg via ORAL
  Filled 2012-04-20: qty 1

## 2012-04-20 NOTE — Progress Notes (Signed)
   CARE MANAGEMENT NOTE 04/20/2012  Patient:  Jeffery Price, Jeffery Price   Account Number:  [id number]  Date Initiated:  04/20/2012  Documentation initiated by:  [facility]  Subjective/Objective Assessment:   s/p right TKA     Action/Plan:   HH needed   Anticipated DC Date:  04/20/2012   Anticipated DC Plan:  HOME W HOME HEALTH SERVICES      DC Planning Services  CM consult      [facility] Choice  HOME HEALTH   Choice offered to / List presented to:  C-1 Patient           HH agency  [facility]   Status of service:  In process, will continue to follow Medicare Important Message given?   (If response is "NO", the following Medicare IM given date fields will be blank) Date Medicare IM given:   Date Additional Medicare IM given:    Discharge Disposition:    Per UR Regulation:    If discussed at Long Length of Stay Meetings, dates discussed:    Comments:  04/20/2012 1100 Spoke to pt and states he wants to use [facility] again. Has most of his DME from when he had his hip surgery last year. Has reacher, 3n1 and RW. PT will work with pt this weekend and will make any additional request for DME that maybe needed at home. NCM explained we will continue to follow until D/C. Will need HH order from d/c MD with complete F2F for this Medicare pt. Isidoro Donning RN CCM Case Mgmt phone [phone]

## 2012-04-20 NOTE — Progress Notes (Signed)
Physical Therapy Treatment Patient Details Name: Jeffery Price MRN: [id number] DOB: [birth year] Today's Date: 04/20/2012 Time: 1478-2956 PT Time Calculation (min): 27 min  PT Assessment / Plan / Recommendation Comments on Treatment Session  Good progress today with increased gait distance and less assistance/cues needed with mobility.    Follow Up Recommendations  Home health PT       Equipment Recommendations  None recommended by OT;None recommended by PT       Frequency 7X/week   Plan Discharge plan remains appropriate;Frequency remains appropriate    Precautions / Restrictions Precautions Precautions: Knee Required Braces or Orthoses: Knee Immobilizer - Right Knee Immobilizer - Right: On except when in CPM Restrictions RLE Weight Bearing: Partial weight bearing RLE Partial Weight Bearing Percentage or Pounds: 50% PWB    Pertinent Vitals/Pain Pt rated his pain as 5/10 before and after session in his right knee. Was premedicated.    Mobility  Bed Mobility Supine to Sit: 5: Supervision Sitting - Scoot to Edge of Bed: 6: Modified independent (Device/Increase time) Details for Bed Mobility Assistance: Bed flat and no rails used. Min vc's on how to use his left leg to move his right leg to edge of bed and off edge of bed. Transfers Sit to Stand: 5: Supervision;From bed;With upper extremity assist Stand to Sit: 5: Supervision;To chair/3-in-1;With armrests;With upper extremity assist Details for Transfer Assistance: min vc's for safety  (hand placement and to be sure he is all the way to the surface before sitting down) Ambulation/Gait Ambulation/Gait Assistance: 5: Supervision Ambulation Distance (Feet): 250 Feet Assistive device: Rolling walker Ambulation/Gait Assistance Details: min vc's/tc's for upright posture, to decrease shoulder elevation with gait, for PWB on right LE and to increase left LE step length with gait. Gait Pattern: Decreased stance time -  right;Decreased step length - left;Antalgic;Trunk flexed;Step-to pattern    Exercises Total Joint Exercises Ankle Circles/Pumps: AROM;Both;10 reps;Supine Quad Sets: AROM;Strengthening;Right;10 reps;Supine Heel Slides: AAROM;Strengthening;Right;10 reps;Supine Hip ABduction/ADduction: AAROM;Strengthening;Right;10 reps;Supine Straight Leg Raises: AAROM;Strengthening;Right;10 reps;Supine       PT Goals Acute Rehab PT Goals PT Goal: Sit to Stand - Progress: Progressing toward goal PT Goal: Ambulate - Progress: Progressing toward goal PT Goal: Perform Home Exercise Program - Progress: Progressing toward goal  Visit Information  Last PT Received On: 04/20/12 Assistance Needed: +1    Subjective Data  Subjective: Reports having another "rough night" last night. Reports some right knee pain currently, agreeable to therapy today.   Cognition  Overall Cognitive Status: Appears within functional limits for tasks assessed/performed Arousal/Alertness: Awake/alert Orientation Level: Appears intact for tasks assessed Behavior During Session: [facility] for tasks performed       End of Session PT - End of Session Equipment Utilized During Treatment: Gait belt;Right knee immobilizer Activity Tolerance: Patient tolerated treatment well Patient left: in chair;with call bell/phone within reach Nurse Communication: Mobility status   GP     Sallyanne Kuster 04/20/2012, 11:57 AM  Sallyanne Kuster, PTA Office- [phone]

## 2012-04-20 NOTE — Progress Notes (Signed)
Physical Therapy Treatment Patient Details Name: Jeffery Price MRN: [id number] DOB: [birth year] Today's Date: 04/20/2012 Time: 4098-1191 PT Time Calculation (min): 35 min  PT Assessment / Plan / Recommendation Comments on Treatment Session  Pt continues to progress with increased activity and less assistance/cues needed with therapy session. Improved AROM this pm from yesterday's measurements.    Follow Up Recommendations  Home health PT       Equipment Recommendations  None recommended by PT;None recommended by OT       Frequency 7X/week   Plan Discharge plan remains appropriate;Frequency remains appropriate    Precautions / Restrictions Precautions Precautions: Knee Required Braces or Orthoses: Knee Immobilizer - Right Knee Immobilizer - Right: On except when in CPM Restrictions RLE Weight Bearing: Partial weight bearing RLE Partial Weight Bearing Percentage or Pounds: 50% PWB    Pertinent Vitals/Pain Reports 5/10 right knee pain before and after session. Premedicated before PT session.    Mobility  Bed Mobility Supine to Sit: 6: Modified independent (Device/Increase time);HOB elevated (HOB approx 40 degrees elevated) Sitting - Scoot to Edge of Bed: 5: Supervision Details for Bed Mobility Assistance: no cues or assitance needed this pm. pt able to advance his right leg off bed without physical assist from PTA or his other leg. Transfers Sit to Stand: 5: Supervision;From bed;With upper extremity assist Stand to Sit: 6: Modified independent (Device/Increase time);To chair/3-in-1;With armrests;With upper extremity assist Details for Transfer Assistance: cues for hand placement with standing from bed for increased safety Ambulation/Gait Ambulation/Gait Assistance: 5: Supervision Ambulation Distance (Feet): 260 Feet Assistive device: Rolling walker Ambulation/Gait Assistance Details: min vc's/tc's for upright posture, to increase bil step length. Pt able to maintain PWB  this pm. Gait Pattern: Step-to pattern;Decreased step length - left;Decreased stance time - right    Exercises Total Joint Exercises Ankle Circles/Pumps: AROM;Both;10 reps;Supine Quad Sets: AROM;Strengthening;Right;10 reps;Supine Heel Slides: AAROM;Strengthening;Right;10 reps;Supine Goniometric ROM: supine right knee AROM 10-70 degrees flexion. Pt left with towel roll under heel for prolonged ext stretch.     PT Goals Acute Rehab PT Goals PT Goal: Sit to Stand - Progress: Progressing toward goal PT Goal: Ambulate - Progress: Progressing toward goal PT Goal: Perform Home Exercise Program - Progress: Progressing toward goal  Visit Information  Last PT Received On: 04/20/12 Assistance Needed: +1    Subjective Data  Subjective: No new complaints, agreeable to therapy this pm.   Cognition  Overall Cognitive Status: Appears within functional limits for tasks assessed/performed Arousal/Alertness: Awake/alert Orientation Level: Appears intact for tasks assessed Behavior During Session: [facility] for tasks performed       End of Session PT - End of Session Equipment Utilized During Treatment: Gait belt;Right knee immobilizer Activity Tolerance: Patient tolerated treatment well Patient left: in chair;with call bell/phone within reach Nurse Communication: Mobility status   GP     Sallyanne Kuster 04/20/2012, 3:52 PM  Sallyanne Kuster, PTA Office- [phone]

## 2012-04-20 NOTE — Progress Notes (Signed)
At 0230 I went in pt's room to stop his IV from beeping and found him standing up in the bathroom by himself, his IV was disconnected from his IV catheter, blood splattered all over his bed, floor, and in bathroom. Pt at this time was oriented but stated he woke up at some point seeing blood everywhere and he decided to get OOB on his own because he did not know what was going on. When he got OOB he pulled his tubing loose resulting in bleeding everywhere. Catheter removed, bleeding stopped, pt assisted back to bed after getting cleaned up. Vitals-98.3, HR 98, R 18, 142/76, O2 sat-78 on room air. Sats increased to 97% when placed on 2L [place]. Pt currently is on PCA Dilaudid, according to RN he became slightly confused during the day as well and got OOB without help. The patient's RN notified Dr. Montez Morita of the situation, receiving orders to discontinue PCA and give Percocet instead. Pt currently resting comfortably in bed, instructed to call staff to get OOB and pt stated understanding. He was apologetic for incident and stated he simply did not know what was going on when he got up. Bed alarm on. Will continue to monitor.                                 Asher Muir, Charge RN

## 2012-04-20 NOTE — Progress Notes (Signed)
Subjective: 2 Days Post-Op Procedure(s) (LRB): TOTAL KNEE ARTHROPLASTY (Right) Patient reports pain as 3 on 0-10 scale.    Objective: Vital signs in last 24 hours: Temp:  [98.2 F (36.8 C)-99.3 F (37.4 C)] 99.3 F (37.4 C) (09/07 1410) Pulse Rate:  [66-98] 95  (09/07 1410) Resp:  [18-20] 20  (09/07 1600) BP: (112-142)/(59-88) 124/59 mmHg (09/07 1410) SpO2:  [78 %-100 %] 100 % (09/07 1600)  Intake/Output from previous day: 09/06 0701 - 09/07 0700 In: 1380 [P.O.:480; I.V.:900] Out: 375 [Urine:375] Intake/Output this shift: Total I/O In: 240 [P.O.:240] Out: 300 [Urine:300]   Basename 04/20/12 0610 04/19/12 0648  HGB 10.4* 12.7*    Basename 04/20/12 0610 04/19/12 0648  WBC 6.1 6.8  RBC 3.13* 3.89*  HCT 29.3* 36.4*  PLT 204 261   No results found for this basename: NA:2,K:2,CL:2,CO2:2,BUN:2,CREATININE:2,GLUCOSE:2,CALCIUM:2 in the last 72 hours  Basename 04/20/12 0610 04/19/12 0648  LABPT -- --  INR 1.79* 1.07    Incision: no drainage  Assessment/Plan: 2 Days Post-Op Procedure(s) (LRB): TOTAL KNEE ARTHROPLASTY (Right) Up with therapy  Jonathon Tan F 04/20/2012, 5:52 PM

## 2012-04-20 NOTE — Progress Notes (Signed)
ANTICOAGULATION CONSULT NOTE   Pharmacy Consult for Coumadin Indication: VTE prophylaxis  No Known Allergies  Patient Measurements: 85.1 kg  Vital Signs: Temp: 99.3 F (37.4 C) (09/07 1410) Temp src: Oral (09/07 1410) BP: 124/59 mmHg (09/07 1410) Pulse Rate: 95  (09/07 1410)  Labs:  Basename 04/20/12 0610 04/19/12 0648  HGB 10.4* 12.7*  HCT 29.3* 36.4*  PLT 204 261  APTT -- --  LABPROT 21.1* 14.1  INR 1.79* 1.07  HEPARINUNFRC -- --  CREATININE -- --  CKTOTAL -- --  CKMB -- --  TROPONINI -- --    Estimated Creatinine Clearance: 71.9 ml/min (by C-G formula based on Cr of 1.11).   Medical History: Past Medical History  Diagnosis Date  . Hypertension     not taking midication due to expense  . Arthritis     osteoarthritis  . Swelling of joint, knee, right     Medications:  Prescriptions prior to admission  Medication Sig Dispense Refill  . etodolac (LODINE) 400 MG tablet Take 400 mg by mouth 2 (two) times daily.      . furosemide (LASIX) 20 MG tablet Take 20 mg by mouth daily.      Marland Kitchen HYDROcodone-acetaminophen (LORCET) 10-650 MG per tablet Take 1 tablet by mouth every 8 (eight) hours as needed. For pain      . UNKNOWN TO PATIENT Other BP medication unsure name gets from Dr. Loleta Chance does not get from pharmacy        Assessment: 61 y/o male patient s/p TKA, started on coumadin for dvt px. INR large increase today after 2 doses of 7.5mg . No bleeding reported, no drug interactions identified. May be diet related. Will give small dose today.  Goal of Therapy:  INR 2-3 Monitor platelets by anticoagulation protocol: Yes   Plan:  Coumadin 1mg  today Check daily PT/INR.  Verlene Mayer, PharmD, BCPS Pager [phone] 04/20/2012,4:46 PM

## 2012-04-21 LAB — CBC
Hemoglobin: 9.9 g/dL — ABNORMAL LOW (ref 13.0–17.0)
MCV: 93.4 fL (ref 78.0–100.0)
Platelets: 217 10*3/uL (ref 150–400)
RBC: 3.04 MIL/uL — ABNORMAL LOW (ref 4.22–5.81)
WBC: 4.8 10*3/uL (ref 4.0–10.5)

## 2012-04-21 MED ORDER — WARFARIN SODIUM 5 MG PO TABS
5.0000 mg | ORAL_TABLET | Freq: Once | ORAL | Status: AC
  Administered 2012-04-21: 5 mg via ORAL
  Filled 2012-04-21: qty 1

## 2012-04-21 NOTE — Progress Notes (Signed)
Subjective: 3 Days Post-Op Procedure(s) (LRB): TOTAL KNEE ARTHROPLASTY (Right) Patient reports pain as 3 on 0-10 scale.    Objective: Vital signs in last 24 hours: Temp:  [98.2 F (36.8 C)-99.9 F (37.7 C)] 99.4 F (37.4 C) (09/08 1638) Pulse Rate:  [79-95] 91  (09/08 1638) Resp:  [16-18] 18  (09/08 1638) BP: (136-147)/(67-76) 138/76 mmHg (09/08 1638) SpO2:  [98 %-100 %] 100 % (09/08 1638)  Intake/Output from previous day: 09/07 0701 - 09/08 0700 In: 240 [P.O.:240] Out: 1400 [Urine:1400] Intake/Output this shift: Total I/O In: 720 [P.O.:720] Out: -    Basename 04/21/12 0540 04/20/12 0610 04/19/12 0648  HGB 9.9* 10.4* 12.7*    Basename 04/21/12 0540 04/20/12 0610  WBC 4.8 6.1  RBC 3.04* 3.13*  HCT 28.4* 29.3*  PLT 217 204   No results found for this basename: NA:2,K:2,CL:2,CO2:2,BUN:2,CREATININE:2,GLUCOSE:2,CALCIUM:2 in the last 72 hours  Basename 04/21/12 0540 04/20/12 0610  LABPT -- --  INR 1.62* 1.79*    Neurovascular intact  Assessment/Plan: 3 Days Post-Op Procedure(s) (LRB): TOTAL KNEE ARTHROPLASTY (Right) Discharge home with home health  Jerlean Peralta F 04/21/2012, 6:57 PM

## 2012-04-21 NOTE — Progress Notes (Signed)
ANTICOAGULATION CONSULT NOTE - Follow Up Consult  Pharmacy Consult for Coumadin Indication: VTE prophylaxis  No Known Allergies  Patient Measurements: Height: 5\' 8"  (172.7 cm) Weight: 185 lb (83.915 kg) IBW/kg (Calculated) : 68.4   Vital Signs: Temp: 98.2 F (36.8 C) (09/08 0607) Temp src: Oral (09/08 0607) BP: 136/67 mmHg (09/08 0607) Pulse Rate: 79  (09/08 0607)  Labs:  Basename 04/21/12 0540 04/20/12 0610 04/19/12 0648  HGB 9.9* 10.4* --  HCT 28.4* 29.3* 36.4*  PLT 217 204 261  APTT -- -- --  LABPROT 19.5* 21.1* 14.1  INR 1.62* 1.79* 1.07  HEPARINUNFRC -- -- --  CREATININE -- -- --  CKTOTAL -- -- --  CKMB -- -- --  TROPONINI -- -- --    Estimated Creatinine Clearance: 71.9 ml/min (by C-G formula based on Cr of 1.11).   Medications:  Scheduled:    . docusate sodium  100 mg Oral BID  . ferrous sulfate  325 mg Oral TID PC  . furosemide  20 mg Oral Daily  . warfarin  1 mg Oral ONCE-1800  . warfarin  5 mg Oral ONCE-1800  . warfarin   Does not apply Once  . Warfarin - Pharmacist Dosing Inpatient   Does not apply q1800    Assessment: 61 y/o male s/p TKA, started on Coumadin for VTE prophylaxis. Large INR increase after 2 doses of 7.5. Gave 1mg  yesterday and INR fell slightly from 1.79 to 1.62. No bleeding or bruising noted and no drug interactions identified. May be diet related. Will give 5mg  tonight to try to get within therapeutic range.   Goal of Therapy:  INR 2-3 Monitor platelets by anticoagulation protocol: Yes   Plan:  Coumadin 5mg  tonight at 1800. Check daily PT/INR  Lillia Pauls, PharmD Clinical Pharmacist Pager: [phone] Phone: [phone] 04/21/2012 2:41 PM

## 2012-04-21 NOTE — Progress Notes (Signed)
Physical Therapy Treatment Patient Details Name: LIBERATO STANSBERY MRN: [id number] DOB: 08/24/1950 Today's Date: 04/21/2012 Time: 4098-1191 PT Time Calculation (min): 37 min  PT Assessment / Plan / Recommendation Comments on Treatment Session  Continuing improvements with great incr in amb distance and gait pattern    Follow Up Recommendations  Home health PT    Barriers to Discharge        Equipment Recommendations  None recommended by PT;None recommended by OT    Recommendations for Other Services    Frequency 7X/week   Plan Discharge plan remains appropriate;Frequency remains appropriate    Precautions / Restrictions Precautions Precautions: Knee Required Braces or Orthoses: Knee Immobilizer - Right Knee Immobilizer - Right: On except when in CPM Restrictions RLE Weight Bearing: Partial weight bearing RLE Partial Weight Bearing Percentage or Pounds: 50   Pertinent Vitals/Pain 7/10 right knee; repositioned    Mobility  Bed Mobility Bed Mobility: Supine to Sit;Sitting - Scoot to Edge of Bed Supine to Sit: 6: Modified independent (Device/Increase time);HOB elevated Sitting - Scoot to Edge of Bed: 6: Modified independent (Device/Increase time) Details for Bed Mobility Assistance: Increased time, but no physical assit needed Transfers Transfers: Sit to Stand;Stand to Sit Sit to Stand: 5: Supervision;From bed;With upper extremity assist Stand to Sit: 5: Supervision Details for Transfer Assistance: Cues fro hand placement Ambulation/Gait Ambulation/Gait Assistance: 5: Supervision Ambulation Distance (Feet): 350 Feet Assistive device: Rolling walker Ambulation/Gait Assistance Details: Good maintainence of PWB; Good progression from step-to to step-through Gait Pattern: Step-through pattern Gait velocity: decreased    Exercises Total Joint Exercises Ankle Circles/Pumps: AROM;Both;10 reps;Supine Quad Sets: AROM;Strengthening;Right;10 reps;Supine Short Arc Quad:  AAROM;Right;10 reps;Supine Heel Slides: AAROM;Strengthening;Right;10 reps;Supine Straight Leg Raises: AAROM;Strengthening;Right;10 reps;Supine   PT Diagnosis:    PT Problem List:   PT Treatment Interventions:     PT Goals Acute Rehab PT Goals Time For Goal Achievement: 04/24/12 Potential to Achieve Goals: Good Pt will go Sit to Stand: with modified independence PT Goal: Sit to Stand - Progress: Progressing toward goal Pt will Ambulate: >150 feet;with modified independence;with rolling walker PT Goal: Ambulate - Progress: Progressing toward goal Pt will Perform Home Exercise Program: Independently PT Goal: Perform Home Exercise Program - Progress: Progressing toward goal  Visit Information  Last PT Received On: 04/21/12 Assistance Needed: +1    Subjective Data  Subjective: agreeable to therex and amb Patient Stated Goal: to go home   Cognition  Overall Cognitive Status: Appears within functional limits for tasks assessed/performed Arousal/Alertness: Awake/alert Orientation Level: Appears intact for tasks assessed Behavior During Session: [facility] for tasks performed    Balance     End of Session PT - End of Session Equipment Utilized During Treatment: Gait belt;Right knee immobilizer Activity Tolerance: Patient tolerated treatment well Patient left: in bathroom;with call bell/phone within reach Nurse Communication: Mobility status   GP     Olen Pel [place], [place] [phone]  04/21/2012, 12:50 PM

## 2012-04-21 NOTE — Progress Notes (Signed)
Physical Therapy Treatment Patient Details Name: Jeffery Price MRN: [id number] DOB: 02/07/1951 Today's Date: 04/21/2012 Time: 4098-1191 PT Time Calculation (min): 27 min  PT Assessment / Plan / Recommendation Comments on Treatment Session  continuing gains in mobility with smoother gait and stair training complete; On track for dc tomorrow    Follow Up Recommendations  Home health PT    Barriers to Discharge        Equipment Recommendations  None recommended by PT;None recommended by OT    Recommendations for Other Services    Frequency 7X/week   Plan Discharge plan remains appropriate;Frequency remains appropriate    Precautions / Restrictions Precautions Precautions: Knee Required Braces or Orthoses: Knee Immobilizer - Right Knee Immobilizer - Right: On except when in CPM Restrictions RLE Weight Bearing: Partial weight bearing RLE Partial Weight Bearing Percentage or Pounds: 50   Pertinent Vitals/Pain Reports 4-5/10 pain right knee    Mobility  Bed Mobility Bed Mobility: Supine to Sit;Sitting - Scoot to Edge of Bed;Sit to Supine Supine to Sit: 6: Modified independent (Device/Increase time);HOB elevated Sitting - Scoot to Edge of Bed: 6: Modified independent (Device/Increase time) Sit to Supine: 5: Supervision Details for Bed Mobility Assistance: Still requiring increased time; Reports getting back into bed is quite an effort, but still not needing physical assist Transfers Transfers: Sit to Stand;Stand to Sit Sit to Stand: 6: Modified independent (Device/Increase time) Stand to Sit: 6: Modified independent (Device/Increase time) Details for Transfer Assistance: No need for safety or hand placement cues; Able to rise from low surface Ambulation/Gait Ambulation/Gait Assistance: 5: Supervision Ambulation Distance (Feet): 120 Feet Assistive device: Rolling walker Ambulation/Gait Assistance Details: cued to activate Quad in right stance for knee stability in prep for  walking without KI; continued good step-through; pain a bit better with amb this pm Gait Pattern: Step-through pattern Stairs: Yes Stairs Assistance: 4: Min guard Stair Management Technique: Backwards;With walker Number of Stairs: 1  (verbal cues for technique)    Exercises     PT Diagnosis:    PT Problem List:   PT Treatment Interventions:     PT Goals Acute Rehab PT Goals PT Goal Formulation: With patient Time For Goal Achievement: 04/24/12 Potential to Achieve Goals: Good Pt will go Sit to Stand: with modified independence PT Goal: Sit to Stand - Progress: Met Pt will Ambulate: >150 feet;with modified independence;with rolling walker PT Goal: Ambulate - Progress: Progressing toward goal Pt will Go Up / Down Stairs: 1-2 stairs;with modified independence;with rolling walker PT Goal: Up/Down Stairs - Progress: Progressing toward goal  Visit Information  Last PT Received On: 04/21/12 Assistance Needed: +1    Subjective Data  Subjective: Feels better after getting clarification re: pain meds with nurse Patient Stated Goal: to go home   Cognition  Overall Cognitive Status: Appears within functional limits for tasks assessed/performed Arousal/Alertness: Awake/alert Orientation Level: Appears intact for tasks assessed Behavior During Session: [facility] for tasks performed    Balance     End of Session PT - End of Session Equipment Utilized During Treatment: Right knee immobilizer Activity Tolerance: Patient tolerated treatment well Patient left: in bed;with call bell/phone within reach (getting ready to eat dinner) Nurse Communication: Mobility status   GP     Olen Pel [place], [place] [phone]  04/21/2012, 5:30 PM

## 2012-04-22 ENCOUNTER — Encounter (HOSPITAL_COMMUNITY): Payer: Self-pay | Admitting: General Practice

## 2012-04-22 LAB — PROTIME-INR: Prothrombin Time: 17.8 seconds — ABNORMAL HIGH (ref 11.6–15.2)

## 2012-04-22 MED ORDER — METHOCARBAMOL 500 MG PO TABS: 500.0000 mg | ORAL_TABLET | Freq: Four times a day (QID) | ORAL | Status: AC | PRN

## 2012-04-22 MED ORDER — FERROUS SULFATE 325 (65 FE) MG PO TABS: 325.0000 mg | ORAL_TABLET | Freq: Three times a day (TID) | ORAL | Status: DC

## 2012-04-22 MED ORDER — OXYCODONE-ACETAMINOPHEN 5-325 MG PO TABS: 1.0000 | ORAL_TABLET | ORAL | Status: AC | PRN

## 2012-04-22 MED ORDER — WARFARIN SODIUM 6 MG PO TABS: 6.0000 mg | ORAL_TABLET | Freq: Once | ORAL | Status: DC

## 2012-04-22 MED ORDER — WARFARIN SODIUM 6 MG PO TABS
6.0000 mg | ORAL_TABLET | Freq: Once | ORAL | Status: DC
  Filled 2012-04-22: qty 1

## 2012-04-22 NOTE — Progress Notes (Signed)
Physical Therapy Treatment Patient Details Name: Jeffery Price MRN: [id number] DOB: 1951-08-09 Today's Date: 04/22/2012 Time: 4098-1191 PT Time Calculation (min): 25 min  PT Assessment / Plan / Recommendation Comments on Treatment Session  Pt has made continues progress towards PT goals. All questions and concerns related to functional mobility and HEP addressed.     Follow Up Recommendations  Home health PT    Barriers to Discharge        Equipment Recommendations  None recommended by PT;None recommended by OT    Recommendations for Other Services    Frequency 7X/week   Plan Discharge plan remains appropriate;Frequency remains appropriate    Precautions / Restrictions Precautions Precautions: Knee Required Braces or Orthoses: Knee Immobilizer - Right Knee Immobilizer - Right: On except when in CPM Restrictions Weight Bearing Restrictions: Yes RLE Weight Bearing: Partial weight bearing RLE Partial Weight Bearing Percentage or Pounds: 50   Pertinent Vitals/Pain 4/10    Mobility  Bed Mobility Bed Mobility: Supine to Sit;Sitting - Scoot to Edge of Bed;Sit to Supine Supine to Sit: 6: Modified independent (Device/Increase time);HOB elevated Sitting - Scoot to Edge of Bed: 6: Modified independent (Device/Increase time) Sit to Supine: 5: Supervision Details for Bed Mobility Assistance: Still requiring increased time; Reports getting back into bed is quite an effort, but still not needing physical assist Transfers Transfers: Sit to Stand;Stand to Sit Sit to Stand: 6: Modified independent (Device/Increase time) Stand to Sit: 6: Modified independent (Device/Increase time) Details for Transfer Assistance: No need for safety or hand placement cues; Able to rise from low surface Ambulation/Gait Ambulation/Gait Assistance: 5: Supervision Ambulation Distance (Feet): 400 Feet Assistive device: Rolling walker Ambulation/Gait Assistance Details: VCs for step through cadence Gait  Pattern: Step-through pattern Gait velocity: decreased Stairs: Yes Stairs Assistance: 4: Min guard Stair Management Technique: Backwards;With walker      PT Goals Acute Rehab PT Goals PT Goal Formulation: With patient Time For Goal Achievement: 04/24/12 Potential to Achieve Goals: Good Pt will go Sit to Stand: with modified independence PT Goal: Sit to Stand - Progress: Met Pt will Ambulate: >150 feet;with modified independence;with rolling walker PT Goal: Ambulate - Progress: Progressing toward goal Pt will Go Up / Down Stairs: 1-2 stairs;with modified independence;with rolling walker PT Goal: Up/Down Stairs - Progress: Met  Visit Information  Last PT Received On: 04/22/12 Assistance Needed: +1    Subjective Data  Subjective: Feels better after getting clarification re: pain meds with nurse Patient Stated Goal: to go home   Cognition  Overall Cognitive Status: Appears within functional limits for tasks assessed/performed Arousal/Alertness: Awake/alert Orientation Level: Appears intact for tasks assessed Behavior During Session: [facility] for tasks performed       End of Session PT - End of Session Equipment Utilized During Treatment: Gait belt Activity Tolerance: Patient tolerated treatment well Patient left: in chair;with call bell/phone within reach Nurse Communication: Mobility status CPM Right Knee CPM Right Knee: Off   GP     Fabio Asa 04/22/2012, 11:06 AM Charlotte Crumb, PT DPT  [phone]

## 2012-04-22 NOTE — Progress Notes (Signed)
ANTICOAGULATION CONSULT NOTE - Follow Up Consult  Pharmacy Consult for Coumadin Indication: VTE prophylaxis  No Known Allergies  Patient Measurements: Height: 5\' 8"  (172.7 cm) Weight: 185 lb (83.915 kg) IBW/kg (Calculated) : 68.4   Vital Signs: Temp: 98 F (36.7 C) (09/09 0644) Temp src: Oral (09/09 0644) BP: 139/70 mmHg (09/09 0644) Pulse Rate: 79  (09/09 0644)  Labs:  Basename 04/22/12 0720 04/21/12 0540 04/20/12 0610  HGB -- 9.9* 10.4*  HCT -- 28.4* 29.3*  PLT -- 217 204  APTT -- -- --  LABPROT 17.8* 19.5* 21.1*  INR 1.44 1.62* 1.79*  HEPARINUNFRC -- -- --  CREATININE -- -- --  CKTOTAL -- -- --  CKMB -- -- --  TROPONINI -- -- --    Estimated Creatinine Clearance: 71.9 ml/min (by C-G formula based on Cr of 1.11).   Medications:  Scheduled:     . docusate sodium  100 mg Oral BID  . ferrous sulfate  325 mg Oral TID PC  . furosemide  20 mg Oral Daily  . warfarin  5 mg Oral ONCE-1800  . warfarin   Does not apply Once  . Warfarin - Pharmacist Dosing Inpatient   Does not apply q1800    Assessment: 61 y/o male s/p TKA, on Coumadin for VTE prophylaxis. INR subtherapeutic No bleeding noted per chart. Likely discharge home soon  Goal of Therapy:  INR 2-3 Monitor platelets by anticoagulation protocol: Yes   Plan:  Coumadin 6mg  today F/u daily INR If discharge home, recommend continue 5mg  daily from tomorrow.   Bayard Hugger, PharmD, BCPS  Clinical Pharmacist  Pager: [phone]   04/22/2012 8:32 AM

## 2012-04-23 NOTE — Discharge Summary (Signed)
Jeffery Price, Jeffery Price NO.:  [id number]  MEDICAL RECORD NO.:  [id number]  LOCATION:  5N05C                        FACILITY:  [facility]  PHYSICIAN:  Myrtie Neither, MD      DATE OF BIRTH:  1951/01/24  DATE OF ADMISSION:  04/18/2012 DATE OF DISCHARGE:  04/22/2012                              DISCHARGE SUMMARY   ADMITTING DIAGNOSES: 1. Degenerative joint disease, right knee. 2. History of hypertension.  DISCHARGE DIAGNOSES: 1. Degenerative joint disease, right knee. 2. History of hypertension.  COMPLICATIONS:  None.  INFECTIONS:  None.  OPERATION:  Right total knee arthroplasty.  PERTINENT HISTORY:  This is a 61 year old male who has been followed in the office for degenerative joint disease, treated with therapeutic injections, aspirations and anti-inflammatories with progressive worsening of pain, loss of function, recurrent effusion of the right knee.  X-rays revealed loss of medial and lateral compartment joint space, degenerative joint changes.  Pertinent physical was done at right knee +3 effusion.  Limited range of motion, tender medial and lateral compartment and patellofemoral joint.  Negative Homans test.  HOSPITAL COURSE:  The patient underwent preop laboratory, CBC, EKG, chest x-ray, PT, PTT, INR, UA, CMET.  The patient's lab was found to be stable and undergo surgery and underwent right total knee arthroplasty. Postoperative course was fairly benign.  The patient was started on physical therapy and pre and postop IV antibiotics, prophylactic Coumadin therapy, uses CPM.  Avoid physical therapy with partial weightbearing 50% on the right side.  The patient progressed quite well, remained afebrile.  Hemoglobin and hematocrit remained stable.  Pain was brought under control with use of Percocet 2 q.4 hours p.r.n. for pain. The patient is being discharged home with Physical Therapy and Home Health Service.  We will continue Coumadin for another 2 weeks  and we will have weekly INRs.  The patient will be discharged on Percocet 2 q.4 hours p.r.n. for pain, ferrous sulfate 325 mg t.i.d., Coumadin 5 mg daily, Robaxin 500 mg b.i.d., partial weightbearing on the right side.  Home Health and Physical Therapy.  The patient to return to the office in 1 week.  The patient is being discharged in stable and satisfactory condition.     Myrtie Neither, MD     AC/MEDQ  D:  04/22/2012  T:  04/23/2012  Job:  [id number]

## 2012-05-16 ENCOUNTER — Ambulatory Visit: Payer: Medicare Other | Attending: Orthopedic Surgery | Admitting: Rehabilitative and Restorative Service Providers"

## 2012-05-16 DIAGNOSIS — IMO0001 Reserved for inherently not codable concepts without codable children: Secondary | ICD-10-CM | POA: Insufficient documentation

## 2012-05-16 DIAGNOSIS — M25669 Stiffness of unspecified knee, not elsewhere classified: Secondary | ICD-10-CM | POA: Insufficient documentation

## 2012-05-16 DIAGNOSIS — M6281 Muscle weakness (generalized): Secondary | ICD-10-CM | POA: Insufficient documentation

## 2012-05-16 DIAGNOSIS — Z96659 Presence of unspecified artificial knee joint: Secondary | ICD-10-CM | POA: Insufficient documentation

## 2012-05-20 ENCOUNTER — Encounter: Payer: Medicare Other | Admitting: Physical Therapy

## 2012-05-21 ENCOUNTER — Ambulatory Visit: Payer: Medicare Other

## 2012-05-22 ENCOUNTER — Ambulatory Visit: Payer: Medicare Other | Admitting: Rehabilitation

## 2012-05-23 ENCOUNTER — Encounter: Payer: Medicare Other | Admitting: Physical Therapy

## 2012-05-27 ENCOUNTER — Ambulatory Visit: Payer: Medicare Other | Admitting: Physical Therapy

## 2012-05-30 ENCOUNTER — Encounter: Payer: Medicare Other | Admitting: Physical Therapy

## 2012-06-03 ENCOUNTER — Encounter: Payer: Medicare Other | Admitting: Physical Therapy

## 2012-06-06 ENCOUNTER — Encounter: Payer: Medicare Other | Admitting: Physical Therapy

## 2012-09-24 ENCOUNTER — Other Ambulatory Visit: Payer: Self-pay | Admitting: Family Medicine

## 2012-09-24 DIAGNOSIS — R609 Edema, unspecified: Secondary | ICD-10-CM

## 2012-09-25 ENCOUNTER — Ambulatory Visit
Admission: RE | Admit: 2012-09-25 | Discharge: 2012-09-25 | Disposition: A | Discharge status: Home / Self Care | Payer: Medicare Other | Source: Ambulatory Visit | Attending: Family Medicine | Admitting: Family Medicine

## 2012-09-25 DIAGNOSIS — R609 Edema, unspecified: Secondary | ICD-10-CM

## 2012-10-19 IMAGING — CR DG HIP (WITH OR WITHOUT PELVIS) 2-3V*L*
2 series · 2 of 2 positions shown · non-contrast
Comparison: None.

CLINICAL DATA: Left hip pain radiating to the left leg

LEFT HIP - COMPLETE 2+ VIEW

[view not recorded (1 of 2)]
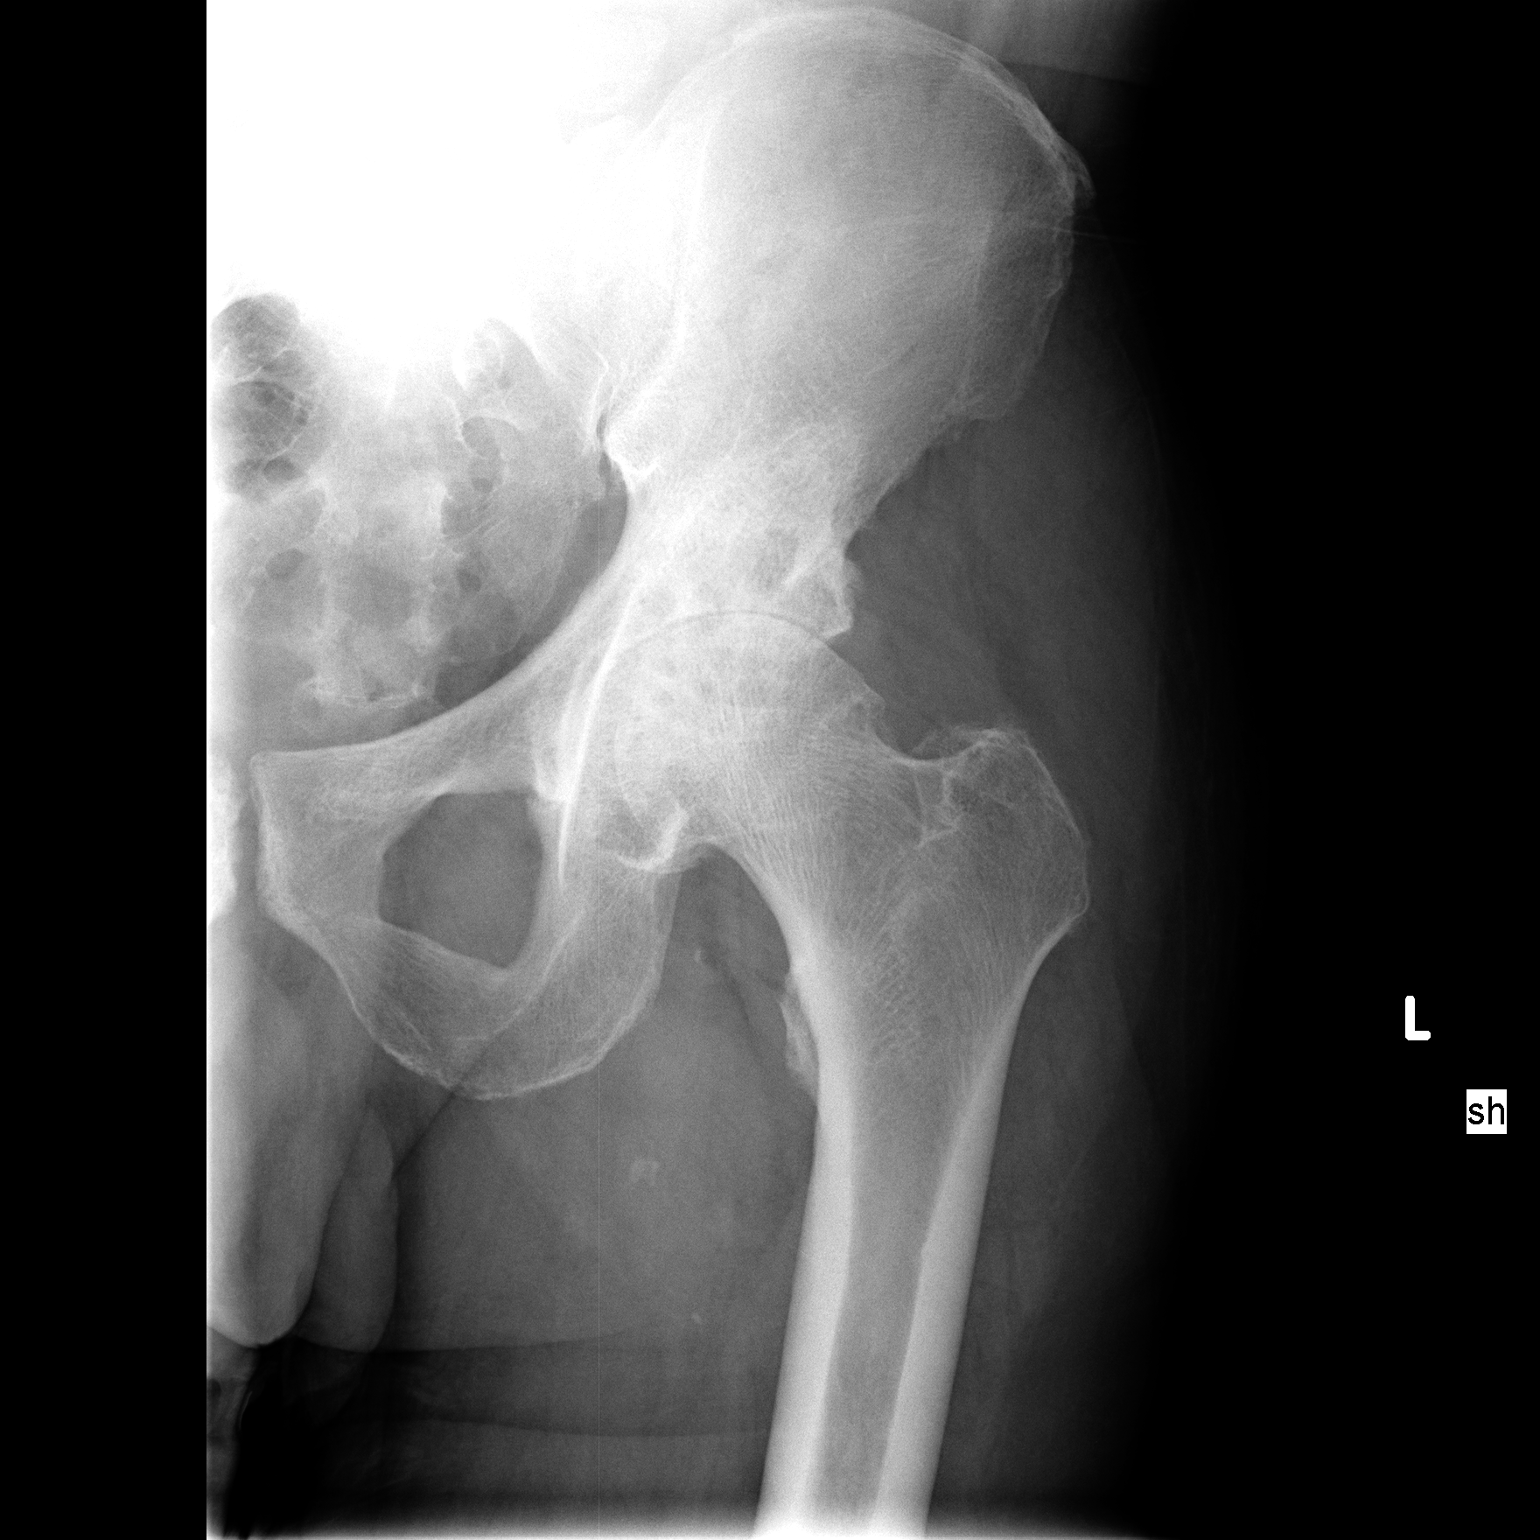

[view not recorded (2 of 2)]
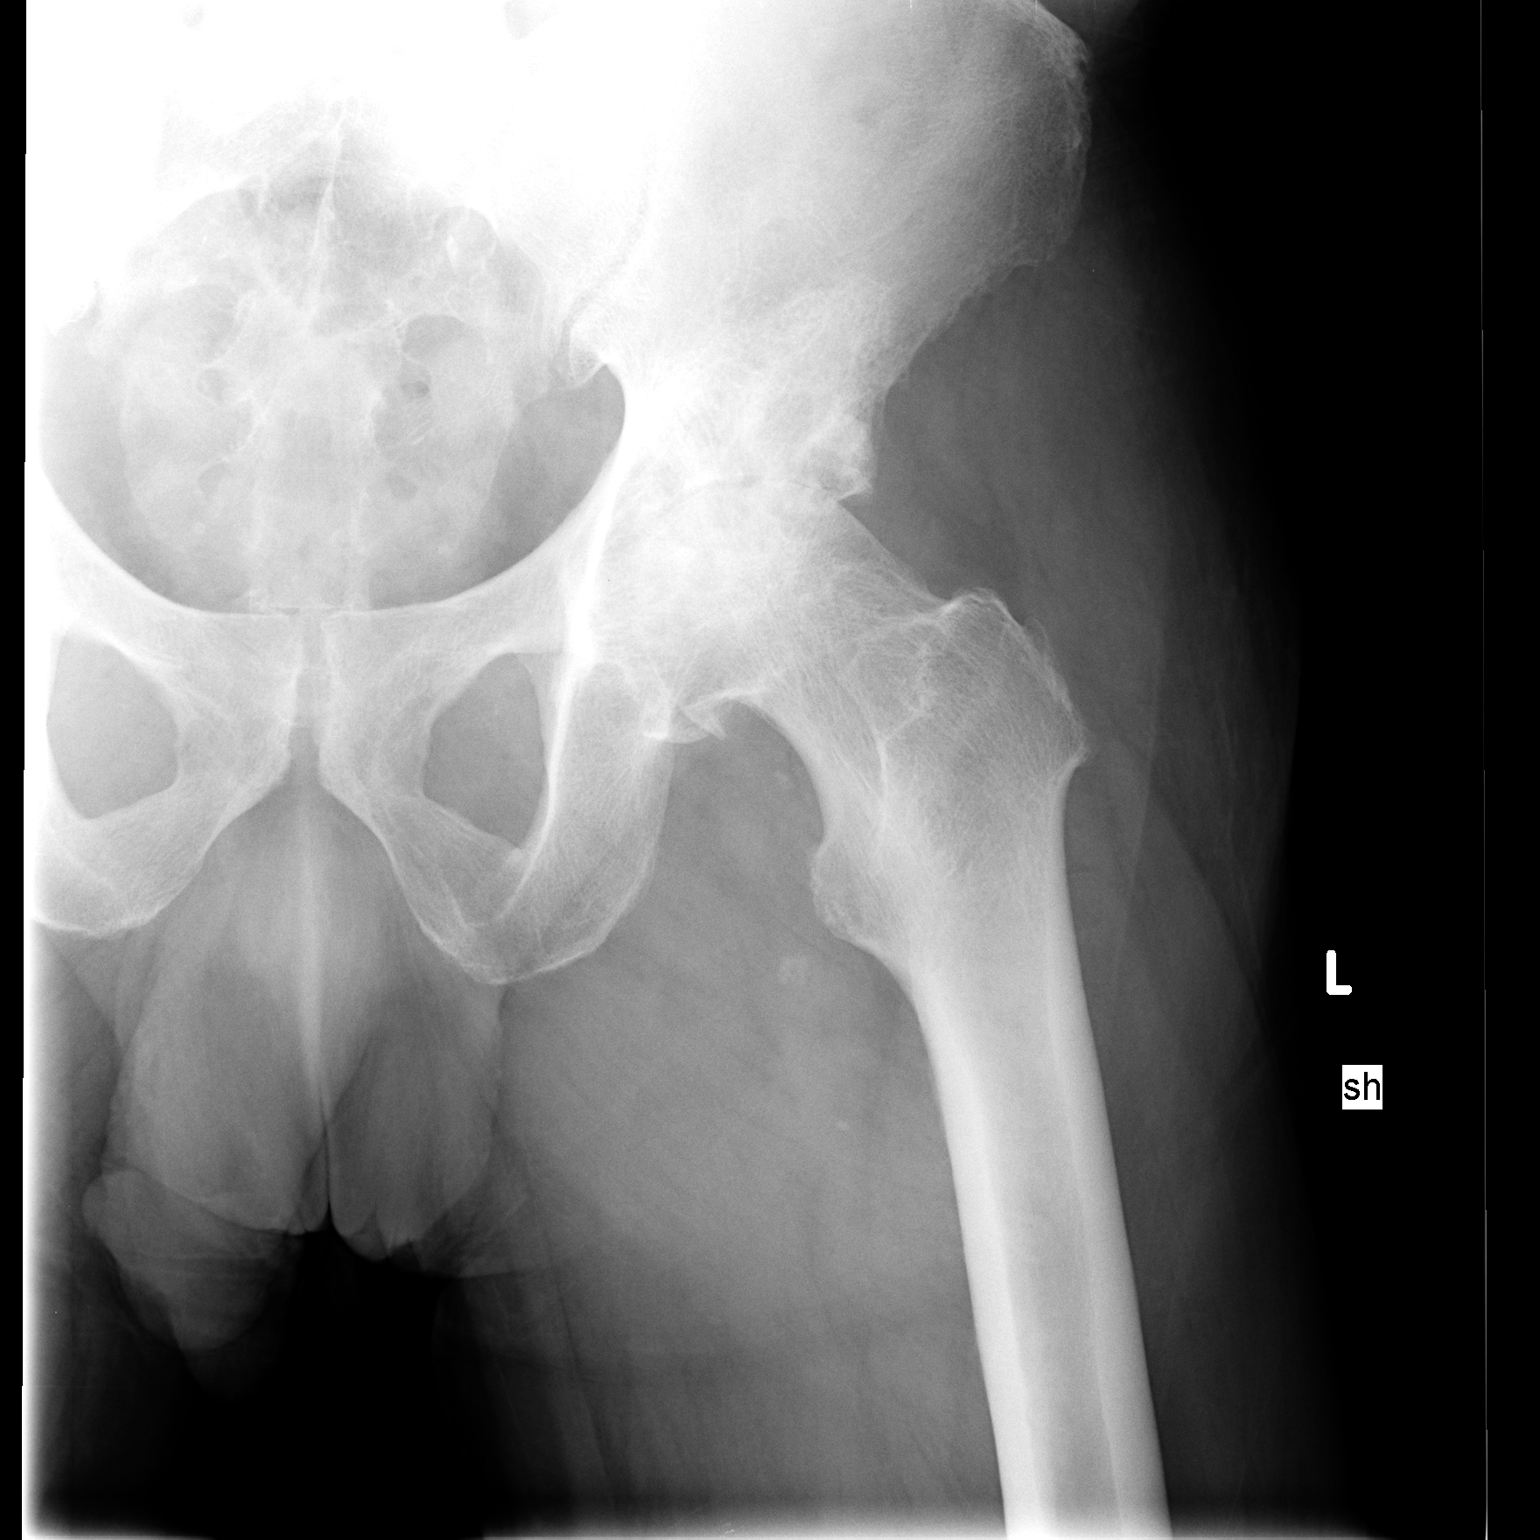

[2 of 2 positions shown; findings below may reference images not displayed]

FINDINGS: There is significant degenerative joint disease of the
left hip.  There is loss of joint space, sclerosis, and subchondral
cyst formation present with mild spurring.  No acute abnormality is
seen.  The left ramus is intact.  The left SI joint appears normal.
IMPRESSION: Significant degenerative joint disease of the left hip.  No acute
abnormality.

## 2013-07-28 ENCOUNTER — Ambulatory Visit
Admission: RE | Admit: 2013-07-28 | Discharge: 2013-07-28 | Disposition: A | Discharge status: Home / Self Care | Payer: Medicare Other | Source: Ambulatory Visit | Attending: Orthopedic Surgery | Admitting: Orthopedic Surgery

## 2013-07-28 ENCOUNTER — Other Ambulatory Visit: Payer: Self-pay | Admitting: Orthopedic Surgery

## 2013-07-28 DIAGNOSIS — M25552 Pain in left hip: Secondary | ICD-10-CM

## 2014-06-26 ENCOUNTER — Ambulatory Visit
Admission: RE | Admit: 2014-06-26 | Discharge: 2014-06-26 | Disposition: A | Discharge status: Home / Self Care | Payer: Medicare Other | Source: Ambulatory Visit | Attending: Orthopedic Surgery | Admitting: Orthopedic Surgery

## 2014-06-26 ENCOUNTER — Other Ambulatory Visit: Payer: Self-pay | Admitting: Orthopedic Surgery

## 2014-06-26 DIAGNOSIS — M109 Gout, unspecified: Secondary | ICD-10-CM

## 2015-05-18 ENCOUNTER — Other Ambulatory Visit (HOSPITAL_COMMUNITY): Payer: Self-pay | Admitting: Orthopaedic Surgery

## 2015-05-27 ENCOUNTER — Encounter (HOSPITAL_COMMUNITY)
Admission: RE | Admit: 2015-05-27 | Discharge: 2015-05-27 | Disposition: A | Discharge status: Home / Self Care | Payer: Commercial Managed Care - HMO | Source: Ambulatory Visit | Attending: Orthopaedic Surgery | Admitting: Orthopaedic Surgery

## 2015-05-27 ENCOUNTER — Encounter (HOSPITAL_COMMUNITY): Payer: Self-pay

## 2015-05-27 DIAGNOSIS — Z01818 Encounter for other preprocedural examination: Secondary | ICD-10-CM | POA: Insufficient documentation

## 2015-05-27 DIAGNOSIS — Z01812 Encounter for preprocedural laboratory examination: Secondary | ICD-10-CM | POA: Diagnosis not present

## 2015-05-27 DIAGNOSIS — I10 Essential (primary) hypertension: Secondary | ICD-10-CM | POA: Insufficient documentation

## 2015-05-27 DIAGNOSIS — M179 Osteoarthritis of knee, unspecified: Secondary | ICD-10-CM

## 2015-05-27 DIAGNOSIS — J9811 Atelectasis: Secondary | ICD-10-CM | POA: Insufficient documentation

## 2015-05-27 DIAGNOSIS — M1710 Unilateral primary osteoarthritis, unspecified knee: Secondary | ICD-10-CM

## 2015-05-27 LAB — URINALYSIS, ROUTINE W REFLEX MICROSCOPIC
GLUCOSE, UA: NEGATIVE mg/dL
HGB URINE DIPSTICK: NEGATIVE
KETONES UR: NEGATIVE mg/dL
LEUKOCYTES UA: NEGATIVE
Nitrite: NEGATIVE
PROTEIN: NEGATIVE mg/dL
Specific Gravity, Urine: 1.011 (ref 1.005–1.030)
UROBILINOGEN UA: 0.2 mg/dL (ref 0.0–1.0)
pH: 5.5 (ref 5.0–8.0)

## 2015-05-27 LAB — CBC
HCT: 39.1 % (ref 39.0–52.0)
Hemoglobin: 13.7 g/dL (ref 13.0–17.0)
MCH: 32.5 pg (ref 26.0–34.0)
MCHC: 35 g/dL (ref 30.0–36.0)
MCV: 92.9 fL (ref 78.0–100.0)
PLATELETS: 236 10*3/uL (ref 150–400)
RBC: 4.21 MIL/uL — ABNORMAL LOW (ref 4.22–5.81)
RDW: 12.9 % (ref 11.5–15.5)
WBC: 3.1 10*3/uL — ABNORMAL LOW (ref 4.0–10.5)

## 2015-05-27 LAB — COMPREHENSIVE METABOLIC PANEL
ALBUMIN: 3.9 g/dL (ref 3.5–5.0)
ALT: 41 U/L (ref 17–63)
ANION GAP: 10 (ref 5–15)
AST: 69 U/L — ABNORMAL HIGH (ref 15–41)
Alkaline Phosphatase: 40 U/L (ref 38–126)
BUN: 5 mg/dL — ABNORMAL LOW (ref 6–20)
CO2: 25 mmol/L (ref 22–32)
Calcium: 9.7 mg/dL (ref 8.9–10.3)
Chloride: 104 mmol/L (ref 101–111)
Creatinine, Ser: 1.09 mg/dL (ref 0.61–1.24)
GFR calc non Af Amer: >60 mL/min (ref >60)
GLUCOSE: 95 mg/dL (ref 65–99)
POTASSIUM: 4 mmol/L (ref 3.5–5.1)
SODIUM: 139 mmol/L (ref 135–145)
Total Bilirubin: 0.6 mg/dL (ref 0.3–1.2)
Total Protein: 6.9 g/dL (ref 6.5–8.1)

## 2015-05-27 LAB — SURGICAL PCR SCREEN
MRSA, PCR: NEGATIVE
Staphylococcus aureus: NEGATIVE

## 2015-05-27 LAB — PROTIME-INR
INR: 1.09 (ref 0.00–1.49)
Prothrombin Time: 14.3 seconds (ref 11.6–15.2)

## 2015-05-27 NOTE — Pre-Procedure Instructions (Signed)
DASHEL GOINES  05/27/2015      [facility] [place], [place] - [address] [place] [address] Phone: [phone] Fax: [phone]    Your procedure is scheduled on Oct ober 17  Report to [place] at 1030 A.M.  Call this number if you have problems the morning of surgery:  [phone]   Remember:  Do not eat food or drink liquids after midnight.  Take these medicines the morning of surgery with A SIP OF WATER amlodipine (Norvasc), Tramadol (Ultram) if needed  Stop taking aspirin, Ibuprofen, BC's, Goody's, Herbal medications, Aleve, Fish Oil   Do not wear jewelry, make-up or nail polish.  Do not wear lotions, powders, or perfumes.  You may wear deodorant.  Do not shave 48 hours prior to surgery.  Men may shave face and neck.  Do not bring valuables to the hospital.  [facility] is not responsible for any belongings or valuables.  Contacts, dentures or bridgework may not be worn into surgery.  Leave your suitcase in the car.  After surgery it may be brought to your room.  For patients admitted to the hospital, discharge time will be determined by your treatment team.  Patients discharged the day of surgery will not be allowed to drive home.    Special instructions:  [place] - Preparing for Surgery  Before surgery, you can play an important role.  Because skin is not sterile, your skin needs to be as free of germs as possible.  You can reduce the number of germs on you skin by washing with CHG (chlorahexidine gluconate) soap before surgery.  CHG is an antiseptic cleaner which kills germs and bonds with the skin to continue killing germs even after washing.  Please DO NOT use if you have an allergy to CHG or antibacterial soaps.  If your skin becomes reddened/irritated stop using the CHG and inform your nurse when you arrive at Short Stay.  Do not shave (including legs and underarms) for at  least 48 hours prior to the first CHG shower.  You may shave your face.  Please follow these instructions carefully:   1.  Shower with CHG Soap the night before surgery and the   morning of Surgery.  2.  If you choose to wash your hair, wash your hair first as usual with your   normal shampoo.  3.  After you shampoo, rinse your hair and body thoroughly to remove the  Shampoo.  4.  Use CHG as you would any other liquid soap.  You can apply chg directly to the skin and wash gently with scrungie or a clean washcloth.  5.  Apply the CHG Soap to your body ONLY FROM THE NECK DOWN.        Do not use on open wounds or open sores.  Avoid contact with your eyes,  ears, mouth and genitals (private parts).  Wash genitals (private parts)  with your normal soap.  6.  Wash thoroughly, paying special attention to the area where your surgery   will be performed.  7.  Thoroughly rinse your body with warm water from the neck down.  8.  DO NOT shower/wash with your normal soap after using and rinsing off   the CHG Soap.  9.  Pat yourself dry with a clean towel.            10.  Wear clean pajamas.  11.  Place clean sheets on your bed the night of your first shower and do not sleep with pets.  Day of Surgery  Do not apply any lotions/deoderants the morning of surgery.  Please wear clean clothes to the hospital/surgery center.     Please read over the following fact sheets that you were given. Pain Booklet, Coughing and Deep Breathing, MRSA Information and Surgical Site Infection Prevention

## 2015-05-27 NOTE — Progress Notes (Signed)
PCP is Dr Iona Beard States he had a stress test (greater than 10 years ago)- states that everything was normal. Denies ever seeing a cardiologist  Denies ever having a card cath, or echo.

## 2015-05-30 MED ORDER — CEFAZOLIN SODIUM-DEXTROSE 2-3 GM-% IV SOLR
2.0000 g | INTRAVENOUS | Status: AC
  Administered 2015-05-31: 2 g via INTRAVENOUS
  Filled 2015-05-30: qty 50

## 2015-05-31 ENCOUNTER — Encounter (HOSPITAL_COMMUNITY)
Admission: RE | Disposition: A | Discharge status: Home Health | Payer: Self-pay | Source: Ambulatory Visit | Attending: Orthopaedic Surgery

## 2015-05-31 ENCOUNTER — Encounter (HOSPITAL_COMMUNITY): Payer: Self-pay | Admitting: General Practice

## 2015-05-31 ENCOUNTER — Inpatient Hospital Stay (HOSPITAL_COMMUNITY)
Admission: RE | Admit: 2015-05-31 | Discharge: 2015-06-02 | DRG: 470 | Disposition: A | Discharge status: Home Health | Payer: Commercial Managed Care - HMO | Source: Ambulatory Visit | Attending: Orthopaedic Surgery | Admitting: Orthopaedic Surgery

## 2015-05-31 ENCOUNTER — Inpatient Hospital Stay (HOSPITAL_COMMUNITY): Payer: Commercial Managed Care - HMO | Admitting: Certified Registered"

## 2015-05-31 DIAGNOSIS — M069 Rheumatoid arthritis, unspecified: Principal | ICD-10-CM | POA: Diagnosis present

## 2015-05-31 DIAGNOSIS — I10 Essential (primary) hypertension: Secondary | ICD-10-CM | POA: Diagnosis present

## 2015-05-31 DIAGNOSIS — Z96659 Presence of unspecified artificial knee joint: Secondary | ICD-10-CM

## 2015-05-31 DIAGNOSIS — M25562 Pain in left knee: Secondary | ICD-10-CM | POA: Diagnosis present

## 2015-05-31 HISTORY — PX: KNEE ARTHROPLASTY: SHX992

## 2015-05-31 SURGERY — ARTHROPLASTY, KNEE, TOTAL, USING IMAGELESS COMPUTER-ASSISTED NAVIGATION
Anesthesia: General | Site: Knee | Laterality: Left

## 2015-05-31 MED ORDER — DOCUSATE SODIUM 100 MG PO CAPS
100.0000 mg | ORAL_CAPSULE | Freq: Two times a day (BID) | ORAL | Status: DC
  Administered 2015-05-31 – 2015-06-02 (×4): 100 mg via ORAL
  Filled 2015-05-31 (×4): qty 1

## 2015-05-31 MED ORDER — METHOCARBAMOL 500 MG PO TABS
500.0000 mg | ORAL_TABLET | Freq: Four times a day (QID) | ORAL | Status: DC | PRN
  Administered 2015-05-31 – 2015-06-02 (×5): 500 mg via ORAL
  Filled 2015-05-31 (×6): qty 1

## 2015-05-31 MED ORDER — BUPIVACAINE LIPOSOME 1.3 % IJ SUSP
INTRAMUSCULAR | Status: DC | PRN
  Administered 2015-05-31: 20 mL

## 2015-05-31 MED ORDER — ACETAMINOPHEN 160 MG/5ML PO SOLN
325.0000 mg | ORAL | Status: DC | PRN
  Filled 2015-05-31: qty 20.3

## 2015-05-31 MED ORDER — METHOCARBAMOL 1000 MG/10ML IJ SOLN
500.0000 mg | Freq: Four times a day (QID) | INTRAVENOUS | Status: DC | PRN
  Filled 2015-05-31: qty 5

## 2015-05-31 MED ORDER — CHLORHEXIDINE GLUCONATE 4 % EX LIQD: 60.0000 mL | Freq: Once | CUTANEOUS | Status: DC

## 2015-05-31 MED ORDER — ASPIRIN EC 325 MG PO TBEC
325.0000 mg | DELAYED_RELEASE_TABLET | Freq: Every day | ORAL | Status: DC
Start: 2015-06-01 — End: 2015-06-02
  Administered 2015-06-01 – 2015-06-02 (×2): 325 mg via ORAL
  Filled 2015-05-31 (×2): qty 1

## 2015-05-31 MED ORDER — ONDANSETRON HCL 4 MG PO TABS
4.0000 mg | ORAL_TABLET | Freq: Four times a day (QID) | ORAL | Status: DC | PRN
  Administered 2015-06-01: 4 mg via ORAL
  Filled 2015-05-31: qty 1

## 2015-05-31 MED ORDER — METOCLOPRAMIDE HCL 5 MG PO TABS
5.0000 mg | ORAL_TABLET | Freq: Three times a day (TID) | ORAL | Status: DC | PRN
  Administered 2015-05-31: 5 mg via ORAL
  Filled 2015-05-31: qty 1

## 2015-05-31 MED ORDER — SODIUM CHLORIDE 0.9 % IR SOLN
Status: DC | PRN
  Administered 2015-05-31: 1000 mL

## 2015-05-31 MED ORDER — PROPOFOL 10 MG/ML IV BOLUS
INTRAVENOUS | Status: DC | PRN
  Administered 2015-05-31: 200 mg via INTRAVENOUS

## 2015-05-31 MED ORDER — METOCLOPRAMIDE HCL 5 MG/ML IJ SOLN: 5.0000 mg | Freq: Three times a day (TID) | INTRAMUSCULAR | Status: DC | PRN

## 2015-05-31 MED ORDER — BISACODYL 10 MG RE SUPP: 10.0000 mg | Freq: Every day | RECTAL | Status: DC | PRN

## 2015-05-31 MED ORDER — POTASSIUM CHLORIDE IN NACL 20-0.45 MEQ/L-% IV SOLN
INTRAVENOUS | Status: DC
  Administered 2015-05-31: 18:00:00 via INTRAVENOUS
  Filled 2015-05-31 (×5): qty 1000

## 2015-05-31 MED ORDER — HYDROMORPHONE HCL 1 MG/ML IJ SOLN
INTRAMUSCULAR | Status: AC
  Administered 2015-06-01: 1 mg via INTRAVENOUS
  Filled 2015-05-31: qty 1

## 2015-05-31 MED ORDER — OXYCODONE HCL 5 MG PO TABS
5.0000 mg | ORAL_TABLET | ORAL | Status: DC | PRN
  Administered 2015-05-31: 5 mg via ORAL
  Administered 2015-05-31 – 2015-06-02 (×8): 10 mg via ORAL
  Filled 2015-05-31 (×5): qty 2
  Filled 2015-05-31: qty 1
  Filled 2015-05-31 (×3): qty 2

## 2015-05-31 MED ORDER — ONDANSETRON HCL 4 MG/2ML IJ SOLN: 4.0000 mg | Freq: Four times a day (QID) | INTRAMUSCULAR | Status: DC | PRN

## 2015-05-31 MED ORDER — THROMBIN 20000 UNITS EX KIT
PACK | CUTANEOUS | Status: AC
  Filled 2015-05-31: qty 1

## 2015-05-31 MED ORDER — FENTANYL CITRATE (PF) 250 MCG/5ML IJ SOLN
INTRAMUSCULAR | Status: AC
  Filled 2015-05-31: qty 5

## 2015-05-31 MED ORDER — OXYCODONE HCL 5 MG/5ML PO SOLN: 5.0000 mg | Freq: Once | ORAL | Status: DC | PRN

## 2015-05-31 MED ORDER — LIDOCAINE HCL (CARDIAC) 20 MG/ML IV SOLN
INTRAVENOUS | Status: DC | PRN
  Administered 2015-05-31: 60 mg via INTRAVENOUS

## 2015-05-31 MED ORDER — PHENYLEPHRINE HCL 10 MG/ML IJ SOLN
INTRAMUSCULAR | Status: DC | PRN
  Administered 2015-05-31: 80 ug via INTRAVENOUS

## 2015-05-31 MED ORDER — BUPIVACAINE HCL (PF) 0.5 % IJ SOLN
INTRAMUSCULAR | Status: DC | PRN
  Administered 2015-05-31: 20 mL

## 2015-05-31 MED ORDER — METOPROLOL TARTRATE 1 MG/ML IV SOLN
INTRAVENOUS | Status: DC | PRN
  Administered 2015-05-31: 5 mg via INTRAVENOUS

## 2015-05-31 MED ORDER — POLYETHYLENE GLYCOL 3350 17 G PO PACK: 17.0000 g | PACK | Freq: Every day | ORAL | Status: DC | PRN

## 2015-05-31 MED ORDER — ACETAMINOPHEN 325 MG PO TABS: 650.0000 mg | ORAL_TABLET | Freq: Four times a day (QID) | ORAL | Status: DC | PRN

## 2015-05-31 MED ORDER — BUPIVACAINE-EPINEPHRINE (PF) 0.5% -1:200000 IJ SOLN
INTRAMUSCULAR | Status: AC
  Filled 2015-05-31: qty 30

## 2015-05-31 MED ORDER — LACTATED RINGERS IV SOLN
INTRAVENOUS | Status: DC
  Administered 2015-05-31 (×3): via INTRAVENOUS

## 2015-05-31 MED ORDER — FENTANYL CITRATE (PF) 100 MCG/2ML IJ SOLN
INTRAMUSCULAR | Status: DC | PRN
  Administered 2015-05-31: 50 ug via INTRAVENOUS
  Administered 2015-05-31: 150 ug via INTRAVENOUS
  Administered 2015-05-31: 100 ug via INTRAVENOUS
  Administered 2015-05-31: 150 ug via INTRAVENOUS
  Administered 2015-05-31: 50 ug via INTRAVENOUS

## 2015-05-31 MED ORDER — CEFAZOLIN SODIUM 1-5 GM-% IV SOLN
1.0000 g | Freq: Three times a day (TID) | INTRAVENOUS | Status: AC
  Administered 2015-05-31 – 2015-06-01 (×2): 1 g via INTRAVENOUS
  Filled 2015-05-31 (×2): qty 50

## 2015-05-31 MED ORDER — HYDROMORPHONE HCL 1 MG/ML IJ SOLN
INTRAMUSCULAR | Status: DC | PRN
  Administered 2015-05-31 (×2): 0.5 mg via INTRAVENOUS

## 2015-05-31 MED ORDER — PHENOL 1.4 % MT LIQD: 1.0000 | OROMUCOSAL | Status: DC | PRN

## 2015-05-31 MED ORDER — THROMBIN 20000 UNITS EX KIT
PACK | CUTANEOUS | Status: DC | PRN
  Administered 2015-05-31: 20000 [IU] via TOPICAL

## 2015-05-31 MED ORDER — HYDROMORPHONE HCL 1 MG/ML IJ SOLN
INTRAMUSCULAR | Status: AC
  Filled 2015-05-31: qty 1

## 2015-05-31 MED ORDER — DEXAMETHASONE SODIUM PHOSPHATE 4 MG/ML IJ SOLN
INTRAMUSCULAR | Status: DC | PRN
  Administered 2015-05-31: 4 mg via INTRAVENOUS

## 2015-05-31 MED ORDER — SODIUM CHLORIDE 0.9 % IR SOLN
Status: DC | PRN
  Administered 2015-05-31: 3000 mL

## 2015-05-31 MED ORDER — AMLODIPINE BESYLATE 10 MG PO TABS
10.0000 mg | ORAL_TABLET | Freq: Every day | ORAL | Status: DC
  Administered 2015-05-31 – 2015-06-02 (×3): 10 mg via ORAL
  Filled 2015-05-31 (×3): qty 1

## 2015-05-31 MED ORDER — SUCCINYLCHOLINE CHLORIDE 20 MG/ML IJ SOLN
INTRAMUSCULAR | Status: DC | PRN
  Administered 2015-05-31: 60 mg via INTRAVENOUS

## 2015-05-31 MED ORDER — MENTHOL 3 MG MT LOZG: 1.0000 | LOZENGE | OROMUCOSAL | Status: DC | PRN

## 2015-05-31 MED ORDER — HYDROMORPHONE HCL 1 MG/ML IJ SOLN
0.2500 mg | INTRAMUSCULAR | Status: DC | PRN
  Administered 2015-05-31 (×2): 0.5 mg via INTRAVENOUS

## 2015-05-31 MED ORDER — BUPIVACAINE LIPOSOME 1.3 % IJ SUSP
20.0000 mL | INTRAMUSCULAR | Status: DC
  Filled 2015-05-31: qty 20

## 2015-05-31 MED ORDER — BUPIVACAINE HCL (PF) 0.5 % IJ SOLN
INTRAMUSCULAR | Status: AC
  Filled 2015-05-31: qty 30

## 2015-05-31 MED ORDER — ROCURONIUM 10MG/ML (10ML) SYRINGE FOR MEDFUSION PUMP - OPTIME
INTRAVENOUS | Status: DC | PRN
  Administered 2015-05-31: 30 mg via INTRAVENOUS

## 2015-05-31 MED ORDER — ACETAMINOPHEN 325 MG PO TABS: 325.0000 mg | ORAL_TABLET | ORAL | Status: DC | PRN

## 2015-05-31 MED ORDER — ACETAMINOPHEN 650 MG RE SUPP: 650.0000 mg | Freq: Four times a day (QID) | RECTAL | Status: DC | PRN

## 2015-05-31 MED ORDER — HYDROMORPHONE HCL 1 MG/ML IJ SOLN
1.0000 mg | INTRAMUSCULAR | Status: DC | PRN
  Administered 2015-06-01: 1 mg via INTRAVENOUS
  Filled 2015-05-31: qty 1

## 2015-05-31 MED ORDER — LOSARTAN POTASSIUM 50 MG PO TABS
100.0000 mg | ORAL_TABLET | Freq: Every day | ORAL | Status: DC
Start: 2015-05-31 — End: 2015-06-02
  Administered 2015-05-31 – 2015-06-02 (×3): 100 mg via ORAL
  Filled 2015-05-31 (×3): qty 2

## 2015-05-31 MED ORDER — OXYCODONE HCL 5 MG PO TABS: 5.0000 mg | ORAL_TABLET | Freq: Once | ORAL | Status: DC | PRN

## 2015-05-31 MED ORDER — KETOROLAC TROMETHAMINE 30 MG/ML IJ SOLN
INTRAMUSCULAR | Status: DC | PRN
  Administered 2015-05-31: 30 mg via INTRAVENOUS

## 2015-05-31 SURGICAL SUPPLY — 68 items
APL SKNCLS STERI-STRIP NONHPOA (GAUZE/BANDAGES/DRESSINGS) ×1
BANDAGE ELASTIC 4 VELCRO ST LF (GAUZE/BANDAGES/DRESSINGS) ×3 IMPLANT
BANDAGE ESMARK 6X9 LF (GAUZE/BANDAGES/DRESSINGS) ×1 IMPLANT
BENZOIN TINCTURE PRP APPL 2/3 (GAUZE/BANDAGES/DRESSINGS) ×3 IMPLANT
BLADE SAGITTAL 25.0X1.19X90 (BLADE) ×2 IMPLANT
BLADE SAGITTAL 25.0X1.19X90MM (BLADE) ×1
BLADE SAW SGTL 13X75X1.27 (BLADE) ×3 IMPLANT
BNDG CMPR 9X6 STRL LF SNTH (GAUZE/BANDAGES/DRESSINGS) ×1
BNDG CMPR MED 10X6 ELC LF (GAUZE/BANDAGES/DRESSINGS) ×1
BNDG ELASTIC 6X10 VLCR STRL LF (GAUZE/BANDAGES/DRESSINGS) ×3 IMPLANT
BNDG ESMARK 6X9 LF (GAUZE/BANDAGES/DRESSINGS) ×3
BOWL SMART MIX CTS (DISPOSABLE) ×3 IMPLANT
CAP KNEE TOTAL 3 SIGMA ×3 IMPLANT
CEMENT HV SMART SET (Cement) ×6 IMPLANT
CLOSURE STERI-STRIP 1/2X4 (GAUZE/BANDAGES/DRESSINGS) ×2
CLSR STERI-STRIP ANTIMIC 1/2X4 (GAUZE/BANDAGES/DRESSINGS) ×4 IMPLANT
COVER SURGICAL LIGHT HANDLE (MISCELLANEOUS) ×3 IMPLANT
CUFF TOURNIQUET SINGLE 34IN LL (TOURNIQUET CUFF) ×3 IMPLANT
CUFF TOURNIQUET SINGLE 44IN (TOURNIQUET CUFF) IMPLANT
DRAPE ORTHO SPLIT 77X108 STRL (DRAPES) ×6
DRAPE SURG ORHT 6 SPLT 77X108 (DRAPES) ×2 IMPLANT
DRAPE U-SHAPE 47X51 STRL (DRAPES) ×3 IMPLANT
DRSG PAD ABDOMINAL 8X10 ST (GAUZE/BANDAGES/DRESSINGS) ×6 IMPLANT
DURAPREP 26ML APPLICATOR (WOUND CARE) ×3 IMPLANT
ELECT REM PT RETURN 9FT ADLT (ELECTROSURGICAL) ×3
ELECTRODE REM PT RTRN 9FT ADLT (ELECTROSURGICAL) ×1 IMPLANT
FACESHIELD WRAPAROUND (MASK) ×6 IMPLANT
FACESHIELD WRAPAROUND OR TEAM (MASK) ×2 IMPLANT
GAUZE SPONGE 4X4 12PLY STRL (GAUZE/BANDAGES/DRESSINGS) ×6 IMPLANT
GAUZE XEROFORM 5X9 LF (GAUZE/BANDAGES/DRESSINGS) ×3 IMPLANT
GLOVE BIOGEL PI IND STRL 8 (GLOVE) ×2 IMPLANT
GLOVE BIOGEL PI INDICATOR 8 (GLOVE) ×4
GLOVE ORTHO TXT STRL SZ7.5 (GLOVE) ×6 IMPLANT
GOWN STRL REUS W/ TWL LRG LVL3 (GOWN DISPOSABLE) ×1 IMPLANT
GOWN STRL REUS W/ TWL XL LVL3 (GOWN DISPOSABLE) ×1 IMPLANT
GOWN STRL REUS W/TWL 2XL LVL3 (GOWN DISPOSABLE) ×3 IMPLANT
GOWN STRL REUS W/TWL LRG LVL3 (GOWN DISPOSABLE) ×3
GOWN STRL REUS W/TWL XL LVL3 (GOWN DISPOSABLE) ×3
HANDPIECE INTERPULSE COAX TIP (DISPOSABLE) ×3
IMMOBILIZER KNEE 22 UNIV (SOFTGOODS) ×3 IMPLANT
KIT BASIN OR (CUSTOM PROCEDURE TRAY) ×3 IMPLANT
KIT ROOM TURNOVER OR (KITS) ×3 IMPLANT
MANIFOLD NEPTUNE II (INSTRUMENTS) ×3 IMPLANT
MARKER SPHERE PSV REFLC THRD 5 (MARKER) ×9 IMPLANT
NEEDLE 22X1 1/2 (OR ONLY) (NEEDLE) ×2 IMPLANT
NEEDLE HYPO 25GX1X1/2 BEV (NEEDLE) ×3 IMPLANT
NS IRRIG 1000ML POUR BTL (IV SOLUTION) ×3 IMPLANT
PACK TOTAL JOINT (CUSTOM PROCEDURE TRAY) ×3 IMPLANT
PACK UNIVERSAL I (CUSTOM PROCEDURE TRAY) ×3 IMPLANT
PAD ARMBOARD 7.5X6 YLW CONV (MISCELLANEOUS) ×6 IMPLANT
PAD CAST 4YDX4 CTTN HI CHSV (CAST SUPPLIES) ×1 IMPLANT
PADDING CAST COTTON 4X4 STRL (CAST SUPPLIES) ×3
PADDING CAST COTTON 6X4 STRL (CAST SUPPLIES) ×4 IMPLANT
PIN SCHANZ 4MM 130MM (PIN) ×12 IMPLANT
SET HNDPC FAN SPRY TIP SCT (DISPOSABLE) ×1 IMPLANT
SPONGE GAUZE 4X4 12PLY STER LF (GAUZE/BANDAGES/DRESSINGS) ×3 IMPLANT
STAPLER VISISTAT 35W (STAPLE) IMPLANT
SUCTION FRAZIER TIP 10 FR DISP (SUCTIONS) ×3 IMPLANT
SUT VIC AB 1 CTX 36 (SUTURE) ×6
SUT VIC AB 1 CTX36XBRD ANBCTR (SUTURE) ×2 IMPLANT
SUT VIC AB 2-0 CT1 27 (SUTURE) ×6
SUT VIC AB 2-0 CT1 TAPERPNT 27 (SUTURE) ×2 IMPLANT
SUT VIC AB 3-0 X1 27 (SUTURE) ×6 IMPLANT
SYR CONTROL 10ML LL (SYRINGE) ×3 IMPLANT
SYRINGE 60CC LL (MISCELLANEOUS) ×3 IMPLANT
TOWEL OR 17X24 6PK STRL BLUE (TOWEL DISPOSABLE) ×3 IMPLANT
TOWEL OR 17X26 10 PK STRL BLUE (TOWEL DISPOSABLE) ×3 IMPLANT
WATER STERILE IRR 1000ML POUR (IV SOLUTION) ×3 IMPLANT

## 2015-05-31 NOTE — Anesthesia Procedure Notes (Signed)
Procedure Name: Intubation Date/Time: 05/31/2015 1:38 PM Performed by: Duke Salvia Pre-anesthesia Checklist: Patient identified, Emergency Drugs available, Suction available and Patient being monitored Patient Re-evaluated:Patient Re-evaluated prior to inductionOxygen Delivery Method: Circle system utilized and Simple face mask Preoxygenation: Pre-oxygenation with 100% oxygen Intubation Type: IV induction Ventilation: Mask ventilation without difficulty Laryngoscope Size: Mac and 4 Grade View: Grade I Tube type: Oral Number of attempts: 1 Secured at: 22 cm Tube secured with: Tape

## 2015-05-31 NOTE — Anesthesia Postprocedure Evaluation (Signed)
  Anesthesia Post-op Note  Patient: Jeffery Price  Procedure(s) Performed: Procedure(s): COMPUTER ASSISTED TOTAL KNEE ARTHROPLASTY (Left)  Patient Location: PACU  Anesthesia Type:General  Level of Consciousness: awake and alert   Airway and Oxygen Therapy: Patient Spontanous Breathing  Post-op Pain: mild  Post-op Assessment: Post-op Vital signs reviewed LLE Motor Response: Purposeful movement, Responds to commands LLE Sensation: No numbness, No tingling          Post-op Vital Signs: Reviewed  Last Vitals:  Filed Vitals:   05/31/15 1644  BP: 151/79  Pulse: 99  Temp: 36.7 C  Resp: 15    Complications: No apparent anesthesia complications

## 2015-05-31 NOTE — Anesthesia Preprocedure Evaluation (Addendum)
Anesthesia Evaluation  Patient identified by MRN, date of birth, ID band Patient awake    Reviewed: Allergy & Precautions, NPO status , Patient's Chart, lab work & pertinent test results  Airway Mallampati: II  TM Distance: >3 FB Neck ROM: full    Dental  (+) Edentulous Upper, Edentulous Lower   Pulmonary neg pulmonary ROS, former smoker,    breath sounds clear to auscultation       Cardiovascular Exercise Tolerance: Good METS: 3 - Mets hypertension, On Medications  Rhythm:Regular     Neuro/Psych negative neurological ROS  negative psych ROS   GI/Hepatic negative GI ROS, Neg liver ROS,   Endo/Other  negative endocrine ROS  Renal/GU negative Renal ROS  negative genitourinary   Musculoskeletal   Abdominal   Peds  Hematology negative hematology ROS (+)   Anesthesia Other Findings Patient reports daily alcohol consumption 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 every evening(wife thinks it is MORE)  Reproductive/Obstetrics                           Anesthesia Physical Anesthesia Plan  ASA: II  Anesthesia Plan: General   Post-op Pain Management:    Induction: Intravenous  Airway Management Planned: LMA and Oral ETT  Additional Equipment: None  Intra-op Plan:   Post-operative Plan: Extubation in OR  Informed Consent: I have reviewed the patients History and Physical, chart, labs and discussed the procedure including the risks, benefits and alternatives for the proposed anesthesia with the patient or authorized representative who has indicated his/her understanding and acceptance.     Plan Discussed with: CRNA and Surgeon  Anesthesia Plan Comments:         Anesthesia Quick Evaluation

## 2015-05-31 NOTE — H&P (Signed)
TOTAL KNEE ADMISSION H&P  Patient is being admitted for left total knee arthroplasty.  Subjective:  Chief Complaint:left knee pain.  HPI: Jeffery Price, 64 y.o. male, has a history of pain and functional disability in the left knee due to arthritis and has failed non-surgical conservative treatments for greater than 12 weeks to includeNSAID's and/or analgesics, corticosteriod injections, viscosupplementation injections and activity modification.  Onset of symptoms was gradual, starting >10 years ago with gradually worsening course since that time.  on the left knee(s).  Patient currently rates pain in the left knee(s) at 10 out of 10 with activity. Patient has night pain, worsening of pain with activity and weight bearing, pain that interferes with activities of daily living, pain with passive range of motion, crepitus and joint swelling.  Patient has evidence of subchondral cysts, subchondral sclerosis, periarticular osteophytes and joint space narrowing by imaging studies.  There is no active infection.  There are no active problems to display for this patient.  Past Medical History  Diagnosis Date  . Hypertension     not taking midication due to expense  . Arthritis     osteoarthritis  . Swelling of joint, knee, right     Past Surgical History  Procedure Laterality Date  . Knee ligament reconstruction  1969    left knee  . Colonoscopy    . Knee arthroscopy  01/04/2012    Procedure: ARTHROSCOPY KNEE;  Surgeon: Sharmon Revere, MD;  Location: [place];  Service: Orthopedics;  Laterality: Right;  . Joint replacement  10-,2012    left hip  . Total knee arthroplasty  04/18/2012    Procedure: TOTAL KNEE ARTHROPLASTY;  Surgeon: Sharmon Revere, MD;  Location: [place];  Service: Orthopedics;  Laterality: Right;    Prescriptions prior to admission  Medication Sig Dispense Refill Last Dose  . amLODipine (NORVASC) 10 MG tablet Take 10 mg by mouth daily.   05/30/2015 at Unknown time  . losartan  (COZAAR) 100 MG tablet Take 100 mg by mouth daily.   05/30/2015 at Unknown time  . traMADol (ULTRAM) 50 MG tablet Take 50 mg by mouth every 6 (six) hours as needed (pain).   05/30/2015 at Unknown time   No Known Allergies  Social History  Substance Use Topics  . Smoking status: Former Smoker -- 0.50 packs/day for 10 years    Types: Cigarettes    Quit date: 08/15/1979  . Smokeless tobacco: Never Used     Comment: quit smoking 1981  . Alcohol Use: Yes     Comment: ocasional beer    Family History  Problem Relation Age of Onset  . Anesthesia problems Neg Hx      Review of Systems  Constitutional: Negative.   HENT: Negative.   Eyes: Negative.   Respiratory: Negative.   Cardiovascular: Negative.   Gastrointestinal: Negative.   Genitourinary: Negative.   Musculoskeletal: Positive for joint pain.  Skin: Negative.   Neurological: Negative.     Objective:  Physical Exam  Constitutional: He is oriented to person, place, and time. He appears well-nourished.  HENT:  Head: Atraumatic.  Eyes: Pupils are equal, round, and reactive to light.  Cardiovascular: Normal rate.   Respiratory: Effort normal. No respiratory distress.  GI: He exhibits no distension.  Musculoskeletal: He exhibits tenderness.  Neurological: He is alert and oriented to person, place, and time.  Skin: Skin is warm and dry.  Psychiatric: He has a normal mood and affect.    Vital signs in last  24 hours: Temp:  [98.3 F (36.8 C)] 98.3 F (36.8 C) (10/17 1032) Pulse Rate:  [96] 96 (10/17 1032) Resp:  [20] 20 (10/17 1032) BP: (163)/(72) 163/72 mmHg (10/17 1032) SpO2:  [98 %] 98 % (10/17 1032) Weight:  [83.915 kg (185 lb)] 83.915 kg (185 lb) (10/17 1032)  Labs:   Estimated body mass index is 28.14 kg/(m^2) as calculated from the following:   Height as of this encounter: 5\' 8"  (1.727 m).   Weight as of this encounter: 83.915 kg (185 lb).   Imaging Review Plain radiographs demonstrate moderate  degenerative joint disease of the left knee(s). The overall alignment ismild varus. The bone quality appears to be good for age and reported activity level.  Assessment/Plan:  End stage arthritis, left knee   The patient history, physical examination, clinical judgment of the provider and imaging studies are consistent with end stage degenerative joint disease of the left knee(s) and total knee arthroplasty is deemed medically necessary. The treatment options including medical management, injection therapy arthroscopy and arthroplasty were discussed at length. The risks and benefits of total knee arthroplasty were presented and reviewed. The risks due to aseptic loosening, infection, stiffness, patella tracking problems, thromboembolic complications and other imponderables were discussed. The patient acknowledged the explanation, agreed to proceed with the plan and consent was signed. Patient is being admitted for inpatient treatment for surgery, pain control, PT, OT, prophylactic antibiotics, VTE prophylaxis, progressive ambulation and ADL's and discharge planning. The patient is planning to be discharged home with home health services

## 2015-05-31 NOTE — Interval H&P Note (Signed)
History and Physical Interval Note:  05/31/2015 12:12 PM  Jeffery Price  has presented today for surgery, with the diagnosis of Left Knee Osteoarthritis   The various methods of treatment have been discussed with the patient and family. After consideration of risks, benefits and other options for treatment, the patient has consented to  Procedure(s): COMPUTER ASSISTED TOTAL KNEE ARTHROPLASTY (Left) as a surgical intervention .  The patient's history has been reviewed, patient examined, no change in status, stable for surgery.  I have reviewed the patient's chart and labs.  Questions were answered to the patient's satisfaction.     Valdis Bevill C
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
## 2015-05-31 NOTE — Progress Notes (Signed)
Orthopedic Tech Progress Note Patient Details:  Jeffery Price 24-Oct-1950 [id number] Applied CPM to LLE.  Left Bone Foam with pt.'s nurse. CPM Left Knee CPM Left Knee: On Left Knee Flexion (Degrees): 60 Left Knee Extension (Degrees): 0   [name] 05/31/2015, 5:22 PM

## 2015-06-01 ENCOUNTER — Encounter (HOSPITAL_COMMUNITY): Payer: Self-pay | Admitting: Orthopaedic Surgery

## 2015-06-01 LAB — CBC
HEMATOCRIT: 28 % — AB (ref 39.0–52.0)
HEMOGLOBIN: 9.8 g/dL — AB (ref 13.0–17.0)
MCH: 32.5 pg (ref 26.0–34.0)
MCHC: 35 g/dL (ref 30.0–36.0)
MCV: 92.7 fL (ref 78.0–100.0)
Platelets: 213 10*3/uL (ref 150–400)
RBC: 3.02 MIL/uL — ABNORMAL LOW (ref 4.22–5.81)
RDW: 13.1 % (ref 11.5–15.5)
WBC: 8.2 10*3/uL (ref 4.0–10.5)

## 2015-06-01 LAB — BASIC METABOLIC PANEL
ANION GAP: 9 (ref 5–15)
BUN: 7 mg/dL (ref 6–20)
CALCIUM: 8.7 mg/dL — AB (ref 8.9–10.3)
CHLORIDE: 97 mmol/L — AB (ref 101–111)
CO2: 25 mmol/L (ref 22–32)
Creatinine, Ser: 0.85 mg/dL (ref 0.61–1.24)
GFR calc non Af Amer: >60 mL/min (ref >60)
GLUCOSE: 140 mg/dL — AB (ref 65–99)
Potassium: 3.8 mmol/L (ref 3.5–5.1)
Sodium: 131 mmol/L — ABNORMAL LOW (ref 135–145)

## 2015-06-01 MED ORDER — ALUM & MAG HYDROXIDE-SIMETH 200-200-20 MG/5ML PO SUSP
30.0000 mL | ORAL | Status: DC | PRN
  Administered 2015-06-01: 30 mL via ORAL
  Filled 2015-06-01: qty 30

## 2015-06-01 NOTE — Progress Notes (Signed)
OT Cancellation Note  Patient Details Name: Jeffery Price MRN: [id number] DOB: 16-Oct-1950   Cancelled Treatment:    Reason Eval/Treat Not Completed: Pain limiting ability to participate  Benito Mccreedy OTR/L [phone] 06/01/2015, 4:13 PM

## 2015-06-01 NOTE — Progress Notes (Signed)
Utilization review completed.  

## 2015-06-01 NOTE — Progress Notes (Signed)
Subjective: Doing ok.  Pain controlled. Bleeding through dressing last night.     Objective: Vital signs in last 24 hours: Temp:  [97.8 F (36.6 C)-99 F (37.2 C)] 98.9 F (37.2 C) (10/18 1310) Pulse Rate:  [91-106] 101 (10/18 1310) Resp:  [12-20] 20 (10/18 1310) BP: (121-165)/(62-85) 140/67 mmHg (10/18 1310) SpO2:  [96 %-100 %] 100 % (10/18 1310)  Intake/Output from previous day: 10/17 0701 - 10/18 0700 In: 2740 [P.O.:240; I.V.:2500] Out: 950 [Urine:750; Blood:200] Intake/Output this shift: Total I/O In: 360 [P.O.:360] Out: -    Recent Labs  06/01/15 0442  HGB 9.8*    Recent Labs  06/01/15 0442  WBC 8.2  RBC 3.02*  HCT 28.0*  PLT 213    Recent Labs  06/01/15 0442  NA 131*  K 3.8  CL 97*  CO2 25  BUN 7  CREATININE 0.85  GLUCOSE 140*  CALCIUM 8.7*   No results for input(s): LABPT, INR in the last 72 hours.  Exam:  Alert and oriented.  Moderate draining through dressing which was removed.  Wounds look.  No active drainage from wound.  Calf nontender.  Nvi.    Assessment/Plan: Dressing changed.  Start PT.  Anticipate d/c home tomorrow if progresses well.  Saline lock IV.     Bryah Ocheltree M 06/01/2015, 1:20 PM
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
## 2015-06-01 NOTE — Op Note (Signed)
NAMEBIRT, REINOSO             ACCOUNT NO.:  [id number]  MEDICAL RECORD NO.:  [id number]  LOCATION:  5N10C                        FACILITY:  [facility]  PHYSICIAN:  Psalm Schappell C. Lorin Mercy, M.D.    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  PROCEDURE:  Left knee partial synovectomy, total knee arthroplasty, computer assist.  SURGEON:  Jazper Nikolai C. Lorin Mercy, M.D.  ASSISTANT:  Alyson Locket. Ricard Dillon, PA-C, medically necessary and present for the entire procedure.  TOURNIQUET TIME:  1 hour and 15 minutes.  DRAINS:  None.  COMPONENTS:  De[employer]uy [employer] LCS #4 femur, #4 tibia, 10 mm spacer, 41 mm 3-pegged poly.  DESCRIPTION OF PROCEDURE:  After induction of general anesthesia and orotracheal intubation, the patient had proximal thigh tourniquet applied.  Lateral post, heel bump, DuraPrep, preoperative Ancef prophylaxis 2 g.  Usual total knee sheets, drapes, impervious stockinette, Coban were applied.  Sterile skin marker, Betadine, Steri- Drape sealing the skin, time-out procedure.  Leg was wrapped in Esmarch, tourniquet inflated.  Midline incision was made.  Superficial retinaculum was developed and medial parapatellar incision was made splitting the quad tendon between the medial one-third and lateral two- thirds.  Computer pins were placed inside the skin incision in the distal femur.  Stab incision was made on the tibia and then computer models were made marking the various points to degenerate the femoral and tibial model.  This corresponded to show the valgus deformity, 5 degree flexion deformity.  It only read 4.8 mm of valgus.  When the patient stood up, he slid into 25 degrees of valgus with severe lateral femoral erosion.  There was marginal  osteophytes and some marginal erosion adjacent to the articular surface consistent with his rheumatoid arthritis.  Fibrous hypertrophic synovitis was present with thickened capsule and this was excised protecting the suprapatellar pouch using the Bovie electrocautery and scalpel.  Menisci were resected. Tricompartmental degenerative changes from his rheumatoid arthritis was noted.  Tibial and femoral models were made and 9 mm taken off the distal femur and 9 mm off the high, 7 off the level on the tibia. Chamfer cuts were made on the femur, keel preparation, both femur and tibia were sized 4.  Posterior spurs removed off the femur particularly lateral, fusion of 3/4 curved osteotome and PCL was completely resected. There was fairly thickened looking meniscal pieces both medial and lateral without significant tearing.  Keel preparation trials inserted. Tibial cut was at 0.  Knee reached full extension.  There was less than 0.5 mm difference between medial and lateral balance.  Collateral ligaments were balanced in both flexion/extension.  Trials were removed with pulsatile lavage.  Permanent prosthesis was inserted cementing the tibia followed by femur 10 mm rotating platform poly and then the patella held with a clamp.  Cement was hard at 15 minutes.  Tourniquet deflated, hemostasis obtained.  There was fair amount of bleeding from the partial synovectomy as expected.  Some thrombin spray was applied. Standard layered closure  and nonabsorbable #1 in deep tendon and capsule layer, 2- 0 Vicryl in the subcutaneous tissue.  Subcuticular closure.  Tincture of benzoin, Steri-Strips, postop dressing, knee immobilizer.  Instrument count and needle count was correct.     Maan Zarcone C. Lorin Mercy, M.D.     MCY/MEDQ  D:  05/31/2015  T:  06/01/2015  Job:  [id number]

## 2015-06-01 NOTE — Evaluation (Signed)
Physical Therapy Evaluation Patient Details Name: ZERIC BARANOWSKI MRN: [id number] DOB: [birth year] Today's Date: 06/01/2015   History of Present Illness  Status post L TKA, WBAT, KI ordered;  has a past medical history of Hypertension; Arthritis; and Swelling of joint, knee, right.  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   Clinical Impression   Pt is s/p TKA resulting in the deficits listed below (see PT Problem List).  Pt will benefit from skilled PT to increase their independence and safety with mobility to allow discharge to the venue listed below.      Follow Up Recommendations Home health PT;Supervision - Intermittent    Equipment Recommendations  3in1 (PT);Other (comment) (pt reports he will have hospital bed delivered to home)    Recommendations for Other Services OT consult     Precautions / Restrictions Precautions Precautions: Knee Precaution Booklet Issued: Yes (comment) Precaution Comments: Pt educated to not allow any pillow or bolster under knee for healing with optimal range of motion.  Required Braces or Orthoses: Knee Immobilizer - Left Knee Immobilizer - Left: On except when in CPM Restrictions LLE Weight Bearing: Weight bearing as tolerated      Mobility  Bed Mobility Overal bed mobility: Needs Assistance Bed Mobility: Supine to Sit     Supine to sit: Supervision     General bed mobility comments: used bedrails; supervision for safety  Transfers Overall transfer level: Needs assistance Equipment used: Rolling walker (2 wheeled) Transfers: Sit to/from Stand Sit to Stand: Min guard         General transfer comment: Cues for hand placement and safety; Able to rise from low hallway bench  Ambulation/Gait Ambulation/Gait assistance: Min guard Ambulation Distance (Feet): 60 Feet Assistive device:  Rolling walker (2 wheeled) Gait Pattern/deviations: Step-through pattern (emerging step through pattern)     General Gait Details: Cues for gait sequence and to activate L quad for stance stability  Stairs            Wheelchair Mobility    Modified Rankin (Stroke Patients Only)       Balance Overall balance assessment: Needs assistance           Standing balance-Leahy Scale: Fair                               Pertinent Vitals/Pain Pain Assessment: 0-10 Pain Score: 5  Pain Location: L knee with therex Pain Descriptors / Indicators: Aching Pain Intervention(s): Monitored during session;Repositioned    Home Living Family/patient expects to be discharged to:: Private residence Living Arrangements: Spouse/significant other Available Help at Discharge: Family Type of Home: House Home Access: Stairs to enter Entrance Stairs-Rails: None Entrance Stairs-Number of Steps: 1 Home Layout: Two level;Bed/bath upstairs Home Equipment: Walker - 2 wheels;Cane - single point;Crutches      Prior Function Level of Independence: Independent               Hand Dominance        Extremity/Trunk Assessment   Upper Extremity Assessment: Overall WFL for tasks assessed           Lower Extremity Assessment: LLE deficits/detail   LLE Deficits / Details: Grossly decr AROM and strength, limited by pain postop  Cervical / Trunk Assessment: Normal  Communication   Communication: No difficulties  Cognition Arousal/Alertness: Awake/alert Behavior During Therapy: WFL for tasks  assessed/performed Overall Cognitive Status: Within Functional Limits for tasks assessed                      General Comments      Exercises Total Joint Exercises Quad Sets: AROM;Left;10 reps Short Arc Quad: AROM;Left;10 reps Heel Slides: AROM;AAROM;Left;10 reps Straight Leg Raises: AAROM;AROM;Left;10 reps Goniometric ROM: 0-75      Assessment/Plan    PT  Assessment Patient needs continued PT services  PT Diagnosis Difficulty walking;Acute pain   PT Problem List Decreased strength;Decreased range of motion;Decreased activity tolerance;Decreased balance;Decreased mobility;Decreased knowledge of use of DME;Pain;Decreased knowledge of precautions  PT Treatment Interventions DME instruction;Gait training;Stair training;Functional mobility training;Therapeutic activities;Therapeutic exercise;Balance training;Patient/family education   PT Goals (Current goals can be found in the Care Plan section) Acute Rehab PT Goals Patient Stated Goal: once healed, he is considering pursuing a part-time job PT Goal Formulation: With patient Time For Goal Achievement: 06/08/15 Potential to Achieve Goals: Good    Frequency 7X/week   Barriers to discharge Inaccessible home environment Flight of steps to reach bedroom bathroom    Co-evaluation               End of Session Equipment Utilized During Treatment: Left knee immobilizer Activity Tolerance: Patient tolerated treatment well Patient left: in chair;with call bell/phone within reach Nurse Communication: Mobility status         Time: 8841-6606 PT Time Calculation (min) (ACUTE ONLY): 49 min   Charges:   PT Evaluation $Initial PT Evaluation Tier I: 1 Procedure PT Treatments $Gait Training: 8-22 mins $Therapeutic Exercise: 8-22 mins   PT G Codes:        Quin Hoop 06/01/2015, 11:39 AM  Roney Marion, [place] Pager [phone] Office [phone]

## 2015-06-01 NOTE — Care Management Note (Addendum)
Case Management Note  Patient Details  Name: Jeffery Price MRN: [id number] Date of Birth: 14-Mar-1951  Subjective/Objective:  [age] yr old male s/p left total knee arthroplasty.     Action/Plan:Case manager spoke with patient concerning [place] and DME needs. Choice was offered. Referral was called to [place], [facility]. Patient states he has family support at discharge.    Expected Discharge Date:    06/01/15              Expected Discharge Plan:   Home with Home Health PT  In-House Referral:  NA  Discharge planning Services  CM Consult  Post Acute Care Choice:  Durable Medical Equipment, Home Health Choice offered to:     DME Arranged:  3in1 DME Agency:  [place]:  PT [facility] Agency:  [place]  Status of Service:  Completed, signed off  Medicare Important Message Given:    Date Medicare IM Given:    Medicare IM give by:    Date Additional Medicare IM Given:    Additional Medicare Important Message give by:     If discussed at [place] of Stay Meetings, dates discussed:    Additional Comments:  Ninfa Meeker, RN 06/01/2015, 12:03 PM

## 2015-06-01 NOTE — Clinical Social Work Note (Signed)
CSW consult acknowledged:  Clinical Education officer, museum received consult for SNF placement. PT currently recommending Home Health.  Clinical Social Worker will sign off for now as social work intervention is no longer needed. Please consult [place] again if new need arises.  Glendon Axe, MSW, LCSWA [phone] 06/01/2015 12:03 PM

## 2015-06-02 LAB — BASIC METABOLIC PANEL
Anion gap: 9 (ref 5–15)
CHLORIDE: 95 mmol/L — AB (ref 101–111)
CO2: 29 mmol/L (ref 22–32)
CREATININE: 0.98 mg/dL (ref 0.61–1.24)
Calcium: 9.2 mg/dL (ref 8.9–10.3)
GFR calc Af Amer: >60 mL/min (ref >60)
GFR calc non Af Amer: >60 mL/min (ref >60)
GLUCOSE: 125 mg/dL — AB (ref 65–99)
Potassium: 3.6 mmol/L (ref 3.5–5.1)
Sodium: 133 mmol/L — ABNORMAL LOW (ref 135–145)

## 2015-06-02 LAB — CBC
HCT: 26 % — ABNORMAL LOW (ref 39.0–52.0)
HEMOGLOBIN: 9 g/dL — AB (ref 13.0–17.0)
MCH: 32.1 pg (ref 26.0–34.0)
MCHC: 34.6 g/dL (ref 30.0–36.0)
MCV: 92.9 fL (ref 78.0–100.0)
Platelets: 197 10*3/uL (ref 150–400)
RBC: 2.8 MIL/uL — ABNORMAL LOW (ref 4.22–5.81)
RDW: 12.9 % (ref 11.5–15.5)
WBC: 5 10*3/uL (ref 4.0–10.5)

## 2015-06-02 MED ORDER — OXYCODONE-ACETAMINOPHEN 5-325 MG PO TABS: 2.0000 | ORAL_TABLET | ORAL | Status: DC | PRN

## 2015-06-02 MED ORDER — TIZANIDINE HCL 4 MG PO TABS: 4.0000 mg | ORAL_TABLET | Freq: Four times a day (QID) | ORAL | Status: AC | PRN

## 2015-06-02 NOTE — Progress Notes (Signed)
Subjective: 2 Days Post-Op Procedure(s) (LRB): COMPUTER ASSISTED TOTAL KNEE ARTHROPLASTY (Left) Patient reports pain as moderate.    Objective: Vital signs in last 24 hours: Temp:  [98.9 F (37.2 C)-99.7 F (37.6 C)] 99.7 F (37.6 C) (10/19 0500) Pulse Rate:  [93-101] 94 (10/19 0500) Resp:  [20] 20 (10/19 0500) BP: (135-157)/(66-75) 135/66 mmHg (10/19 0500) SpO2:  [98 %-100 %] 98 % (10/19 0500)  Intake/Output from previous day: 10/18 0701 - 10/19 0700 In: 720 [P.O.:720] Out: 600 [Urine:600] Intake/Output this shift:     Recent Labs  06/01/15 0442 06/02/15 0343  HGB 9.8* 9.0*    Recent Labs  06/01/15 0442 06/02/15 0343  WBC 8.2 5.0  RBC 3.02* 2.80*  HCT 28.0* 26.0*  PLT 213 197    Recent Labs  06/01/15 0442 06/02/15 0343  NA 131* 133*  K 3.8 3.6  CL 97* 95*  CO2 25 29  BUN 7 <5*  CREATININE 0.85 0.98  GLUCOSE 140* 125*  CALCIUM 8.7* 9.2   No results for input(s): LABPT, INR in the last 72 hours.  Neurologically intact  Assessment/Plan: 2 Days Post-Op Procedure(s) (LRB): COMPUTER ASSISTED TOTAL KNEE ARTHROPLASTY (Left) Up with therapy  Home today after stairs. Office 1 wk   Toney Difatta C 06/02/2015, 7:58 AM

## 2015-06-02 NOTE — Progress Notes (Signed)
Physical Therapy Treatment Patient Details Name: Jeffery Price MRN: [id number] DOB: March 25, 1951 Today's Date: 06/02/2015    History of Present Illness Status post L TKA, WBAT, KI ordered;  has a past medical history of Hypertension; Arthritis; and Swelling of joint, knee, right.  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     PT Comments    Overall good progress; flight of steps done with stair training, and he managed well; OK for dc home from PT standpoint   Follow Up Recommendations  Home health PT;Supervision - Intermittent     Equipment Recommendations  3in1 (PT) (No need for hospital bed)    Recommendations for Other Services       Precautions / Restrictions Precautions Precautions: Knee Precaution Booklet Issued: Yes (comment) Precaution Comments: Pt educated to not allow any pillow or bolster under knee for healing with optimal range of motion.  Required Braces or Orthoses: Knee Immobilizer - Left Knee Immobilizer - Left: On except when in CPM (or with PT) Restrictions LLE Weight Bearing: Weight bearing as tolerated    Mobility  Bed Mobility Overal bed mobility: Modified Independent Bed Mobility: Supine to Sit     Supine to sit: Modified independent (Device/Increase time)     General bed mobility comments: used bedrails  Transfers Overall transfer level: Needs assistance Equipment used: Rolling walker (2 wheeled) Transfers: Sit to/from Stand Sit to Stand: Supervision         General transfer comment: Cues for hand placement and safety; less impulsive  Ambulation/Gait Ambulation/Gait assistance: Supervision Ambulation Distance (Feet): 110 Feet Assistive device: Rolling walker (2 wheeled) Gait Pattern/deviations: Step-through pattern;Trunk flexed     General Gait Details: Cues for gait sequence and to  activate L quad for stance stability; cues also fo rupright posture; tending to have bil hips slightly flexed throughout gait cycle   Stairs Stairs: Yes Stairs assistance: Min guard Stair Management: One rail Left;Step to pattern;Forwards Number of Stairs: 12 General stair comments: Demo cues first, then step-by-step cues for sequence/leading foot; managed a flight well; used KI on flight of steps as a precaution, no buckling or giving way L knee observed  Wheelchair Mobility    Modified Rankin (Stroke Patients Only)       Balance             Standing balance-Leahy Scale: Fair                      Cognition Arousal/Alertness: Awake/alert Behavior During Therapy: WFL for tasks assessed/performed;Impulsive;Anxious Overall Cognitive Status: Within Functional Limits for tasks assessed                      Exercises Total Joint Exercises Quad Sets: AROM;Left;10 reps Heel Slides: AROM;AAROM;Left;10 reps Straight Leg Raises: AAROM;AROM;Left;10 reps Goniometric ROM: 0-64 (AAROM more painful with flexion this session)    General Comments        Pertinent Vitals/Pain Pain Assessment: 0-10 Pain Score: 7  Pain Location: L knee Pain Descriptors / Indicators: Aching Pain Intervention(s): Monitored during session;Premedicated before session    Home Living                      Prior Function            PT Goals (current goals can now be found in the care plan section) Acute Rehab PT Goals Patient Stated  Goal: once healed, he is considering pursuing a part-time job PT Goal Formulation: With patient Time For Goal Achievement: 06/08/15 Potential to Achieve Goals: Good Progress towards PT goals: Progressing toward goals    Frequency  7X/week    PT Plan Current plan remains appropriate    Co-evaluation             End of Session Equipment Utilized During Treatment: Left knee immobilizer;Gait belt Activity Tolerance: Patient tolerated  treatment well Patient left: in chair;with call bell/phone within reach     Time: 0832-0904 PT Time Calculation (min) (ACUTE ONLY): 32 min  Charges:  $Gait Training: 8-22 mins $Therapeutic Exercise: 8-22 mins                    G Codes:      Roney Marion Hamff 06/02/2015, 10:06 AM  Roney Marion, [place] Pager [phone] Office [phone]

## 2015-06-02 NOTE — Evaluation (Signed)
Occupational Therapy Evaluation Patient Details Name: Jeffery Price MRN: [id number] DOB: 1950/09/01 Today's Date: 06/02/2015    History of Present Illness Status post L TKA, WBAT, KI ordered;  has a past medical history of Hypertension; Arthritis; and Swelling of joint, knee, right.  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    Clinical Impression   Pt admitted as above. He is overall Min assist LB bathing and dressing (don/doff pants, socks), he requires min vc's for safety w/ RW and hand placement during transfers. Pt was up in his room upon OT arrival standing w/ RW off to his left side, and recliner chair was still elevated w/ leg portion extended. Discussed using call bell for safety and reviewed knee precautions etc. Pt verbalized understanding. Pt plans to d/c later today, He states family will provide PRN assist at discharge and reports no further acute OT needs at this time. Will sign off.    Follow Up Recommendations  No OT follow up;Supervision - Intermittent    Equipment Recommendations  None recommended by OT;Other (comment) (Pt states DME was "delivered yesterday")    Recommendations for Other Services       Precautions / Restrictions Precautions Precautions: Knee Precaution Booklet Issued: Yes (comment) Precaution Comments: Precaution booklet issued/reviewed by previously PT Required Braces or Orthoses: Knee Immobilizer - Left Knee Immobilizer - Left: On except when in CPM Restrictions Weight Bearing Restrictions: Yes LLE Weight Bearing: Weight bearing as tolerated      Mobility Bed Mobility Overal bed mobility: Modified Independent Bed Mobility: Supine to Sit     Supine to sit: Modified independent (Device/Increase time)     General bed mobility comments: Pt standing up in room at foot of bed upon OT arrival  this am, RW off to his left side and recliner was still in reclined position w/ LE portion elevated. Pt was educated in safety, use of call bell for all functional mobility. He verbalized understanding  Transfers Overall transfer level: Needs assistance Equipment used: Rolling walker (2 wheeled) Transfers: Sit to/from Stand Sit to Stand: Supervision         General transfer comment: Cues for hand placement and safety;  impulsive at times. Benefits from min  VC's    Balance Overall balance assessment: Needs assistance Sitting-balance support: No upper extremity supported;Feet supported Sitting balance-Leahy Scale: Good     Standing balance support: Bilateral upper extremity supported;No upper extremity supported;During functional activity Standing balance-Leahy Scale: Fair                              ADL Overall ADL's : Needs assistance/impaired Eating/Feeding: Sitting;Modified independent   Grooming: Wash/dry hands;Wash/dry face;Oral care;Applying deodorant;Set up;Sitting;Modified independent   Upper Body Bathing: Modified independent;Sitting;Set up   Lower Body Bathing: Supervison/ safety;Min guard;Sit to/from stand;Cueing for safety Lower Body Bathing Details (indicate cue type and reason): Pt noted to put RW off to the side when standing at sink to bathe LB, Min VC's for safety and RW placement Upper Body Dressing : Modified independent;Sitting   Lower Body Dressing: Minimal assistance;Sit to/from stand;Cueing for safety Lower Body Dressing Details (indicate cue type and reason): Again, VC's for safety w/ RW, as pt attempts to move w/o RW and pushes RW off to side Toilet Transfer: Supervision/safety;Comfort height toilet;RW;Grab bars;Cueing for safety Toilet Transfer Details (indicate cue type and reason): Cues for safety  and sequencing w/ RW Toileting- Clothing Manipulation and Hygiene: Sit to/from stand;Supervision/safety   Tub/ Shower Transfer:  (Pt reports  that he will initially sponge bathe; states he has a seat in his tub/shower and hand held shower head.  "I've been doing this a long time, I can get into the tub pretty good")   Functional mobility during ADLs: Supervision/safety;Rolling walker;Cueing for safety;Cueing for sequencing General ADL Comments: Pt was educated in Role of OT and participated in ADL retraining session and functional mobility and transfer training in the room this morning after assessment. He is overall Min assist LB bathing and dressing (don/doff pants, socks), he requires min vc's for safety w/ RW and hand placement during transfers. Pt was up in his room upon OT arrival standing w/ RW off to his left side, and recliner chair was still elevated w/ leg portion extended. Discussed using call bell for safety and reviewed knee precautions etc. Pt verbalized understanding however states "I have been doing this a long time, I can get into the tub pretty good"       Vision  Wears glasses at all times; No change from baseline   Perception     Praxis      Pertinent Vitals/Pain Pain Assessment: 0-10 Pain Score: 5  Pain Location: L: knee Pain Descriptors / Indicators: Aching Pain Intervention(s): Monitored during session;Repositioned;Limited activity within patient's tolerance     Hand Dominance Right   Extremity/Trunk Assessment Upper Extremity Assessment Upper Extremity Assessment: Overall WFL for tasks assessed   Lower Extremity Assessment Lower Extremity Assessment: Defer to PT evaluation       Communication Communication Communication: No difficulties   Cognition Arousal/Alertness: Awake/alert Behavior During Therapy: WFL for tasks assessed/performed;Impulsive;Anxious Overall Cognitive Status: Within Functional Limits for tasks assessed                     General Comments          Shoulder Instructions      Home Living Family/patient expects to be discharged to:: Private residence Living  Arrangements: Spouse/significant other Available Help at Discharge: Family Type of Home: House Home Access: Stairs to enter Technical brewer of Steps: 1 Entrance Stairs-Rails: None Home Layout: Two level;Bed/bath upstairs Alternate Level Stairs-Number of Steps: flight Alternate Level Stairs-Rails: Left Bathroom Shower/Tub: Teacher, early years/pre: Standard Bathroom Accessibility: Yes   Home Equipment: Environmental consultant - 2 wheels;Cane - single point;Crutches;Bedside commode          Prior Functioning/Environment Level of Independence: Independent             OT Diagnosis: Acute pain   OT Problem List: Pain   OT Treatment/Interventions:      OT Goals(Current goals can be found in the care plan section) Acute Rehab OT Goals Patient Stated Goal: once healed, he is considering pursuing a part-time job OT Goal Formulation: All assessment and education complete, DC therapy  OT Frequency:     Barriers to D/C:            Co-evaluation              End of Session Equipment Utilized During Treatment: Gait belt;Rolling walker;Left knee immobilizer CPM Left Knee CPM Left Knee: Off Nurse Communication: Mobility status  Activity Tolerance: Patient tolerated treatment well Patient left: in chair;with call bell/phone within reach   Time: 0926-1009 OT Time Calculation (min): 43 min Charges:  OT General Charges $OT Visit: 1 Procedure OT Evaluation $Initial OT Evaluation Tier I:  1 Procedure OT Treatments $Self Care/Home Management : 23-37 mins G-Codes: OT G-codes **NOT FOR INPATIENT CLASS** Functional Assessment Tool Used: Clinical Judgement Functional Limitation: Self care Self Care Current Status (E1859): At least 20 percent but less than 40 percent impaired, limited or restricted Self Care Goal Status (M9311): At least 1 percent but less than 20 percent impaired, limited or restricted Self Care Discharge Status [phone]): At least 20 percent but less than 40  percent impaired, limited or restricted  Almyra Deforest, OTR/L 06/02/2015, 10:37 AM

## 2015-06-02 NOTE — Progress Notes (Signed)
Pt ready for d/c per MD. Cleared by PT/OT, equipment has been delivered. Discharge teaching and prescriptions given to pt, all questions answered. Waiting for brother to arrive to transport pt home.   Raquel James  06/02/2015 11:24 AM

## 2015-06-02 NOTE — Discharge Instructions (Signed)
Ok to get incision wet with bathing ( shower).   Elevate knee, work on straight leg raising at home, flexing knee.  Use ice , pain medication as needed.

## 2015-06-18 NOTE — Discharge Summary (Signed)
Patient ID: Jeffery Price MRN: [id number] DOB/AGE: October 26, 1950 64 y.o.  Admit date: 05/31/2015 Discharge date: 06/18/2015  Admission Diagnoses:  Active Problems:   Rheumatoid arthritis involving both knees ([place])   Total knee replacement status   Discharge Diagnoses:  Active Problems:   Rheumatoid arthritis involving both knees ([place])   Total knee replacement status  status post Procedure(s): COMPUTER ASSISTED TOTAL KNEE ARTHROPLASTY  Past Medical History  Diagnosis Date  . Hypertension     not taking midication due to expense  . Arthritis     osteoarthritis  . Swelling of joint, knee, right     Surgeries: Procedure(s): COMPUTER ASSISTED TOTAL KNEE ARTHROPLASTY on 05/31/2015   Consultants:    Discharged Condition: Improved  Hospital Course: Jeffery Price is an 64 y.o. male who was admitted 05/31/2015 for operative treatment of end stage knee djd.  Patient failed conservative treatments (please see the history and physical for the specifics) and had severe unremitting pain that affects sleep, daily activities and work/hobbies. After pre-op clearance, the patient was taken to the operating room on 05/31/2015 and underwent  Procedure(s): COMPUTER ASSISTED TOTAL KNEE ARTHROPLASTY.    Patient was given perioperative antibiotics:  Anti-infectives    Start     Dose/Rate Route Frequency Ordered Stop   05/31/15 2100  ceFAZolin (ANCEF) IVPB 1 g/50 mL premix     1 g 100 mL/hr over 30 Minutes Intravenous 3 times per day 05/31/15 1709 06/01/15 0634   05/31/15 0600  ceFAZolin (ANCEF) IVPB 2 g/50 mL premix     2 g 100 mL/hr over 30 Minutes Intravenous On call to O.R. 05/30/15 1312 05/31/15 1315       Patient was given sequential compression devices and early ambulation to prevent DVT.   Patient benefited maximally from hospital stay and there were no complications. At the time of discharge, the patient was urinating/moving their bowels without difficulty, tolerating a  regular diet, pain is controlled with oral pain medications and they have been cleared by PT/OT.   Recent vital signs: No data found.    Recent laboratory studies: No results for input(s): WBC, HGB, HCT, PLT, NA, K, CL, CO2, BUN, CREATININE, GLUCOSE, INR, CALCIUM in the last 72 hours.  Invalid input(s): PT, 2   Discharge Medications:     Medication List    STOP taking these medications        traMADol 50 MG tablet  Commonly known as:  ULTRAM      TAKE these medications        amLODipine 10 MG tablet  Commonly known as:  NORVASC  Take 10 mg by mouth daily.     losartan 100 MG tablet  Commonly known as:  COZAAR  Take 100 mg by mouth daily.     oxyCODONE-acetaminophen 5-325 MG tablet  Commonly known as:  ROXICET  Take 2 tablets by mouth every 4 (four) hours as needed for severe pain.     tiZANidine 4 MG tablet  Commonly known as:  ZANAFLEX  Take 1 tablet (4 mg total) by mouth every 6 (six) hours as needed for muscle spasms.        Diagnostic Studies: Dg Chest 2 View  05/27/2015  CLINICAL DATA:  Hypertension. EXAM: CHEST  2 VIEW COMPARISON:  05/26/2011. FINDINGS: Mediastinum and hilar structures are normal. Lungs are clear of acute infiltrates. Stable atelectasis right lung base. No pleural effusion or pneumothorax. Heart size stable. Stable scoliosis. IMPRESSION: Stable exam with stable atelectasis right  lung base. No acute abnormality . Electronically Signed   By: [place]   On: 05/27/2015 10:13          Follow-up Information    Follow up with [facility].   Why:  Someone from [facility] will contact you concerning start date and time for therapy.   Contact information:   [address] [place] [address] [phone]       Schedule an appointment as soon as possible for a visit in 1 week to follow up.      Schedule an appointment as soon as possible for a visit with Marybelle Killings, MD.   Specialty:  Orthopedic  Surgery   Why:  need return office visit 2 weeks postop.  sooner if needed.    Contact information:   [place] [address] [phone]       Discharge Plan:  discharge to home  Disposition:     Signed: Lanae Crumbly  06/18/2015, 11:56 AM

## 2015-06-24 ENCOUNTER — Ambulatory Visit: Payer: Commercial Managed Care - HMO | Attending: Orthopaedic Surgery | Admitting: Physical Therapy

## 2015-06-24 DIAGNOSIS — R29898 Other symptoms and signs involving the musculoskeletal system: Secondary | ICD-10-CM | POA: Diagnosis present

## 2015-06-24 DIAGNOSIS — R269 Unspecified abnormalities of gait and mobility: Secondary | ICD-10-CM | POA: Diagnosis present

## 2015-06-24 DIAGNOSIS — M25462 Effusion, left knee: Secondary | ICD-10-CM | POA: Insufficient documentation

## 2015-06-24 DIAGNOSIS — M25562 Pain in left knee: Secondary | ICD-10-CM

## 2015-06-24 DIAGNOSIS — M25662 Stiffness of left knee, not elsewhere classified: Secondary | ICD-10-CM | POA: Insufficient documentation

## 2015-06-24 NOTE — Therapy (Signed)
[place], [place], [address] Phone: [phone]   Fax:  [phone]  Physical Therapy Evaluation  Patient Details  Name: Jeffery Price MRN: [id number] Date of Birth: May 21, 1951 Referring Provider: Rodell Perna  Encounter Date: 06/24/2015      PT End of Session - 06/24/15 0926    Visit Number 1   Number of Visits 12   Date for PT Re-Evaluation 08/05/15   Authorization Type Medicare, Kx mod by 15th visit, and progress note by 9th visit   PT Start Time 0845   PT Stop Time 0930   PT Time Calculation (min) 45 min   Activity Tolerance Patient tolerated treatment well   Behavior During Therapy [facility] for tasks assessed/performed      Past Medical History  Diagnosis Date  . Hypertension     not taking midication due to expense  . Arthritis     osteoarthritis  . Swelling of joint, knee, right     Past Surgical History  Procedure Laterality Date  . Knee ligament reconstruction  1969    left knee  . Colonoscopy    . Knee arthroscopy  01/04/2012    Procedure: ARTHROSCOPY KNEE;  Surgeon: Sharmon Revere, MD;  Location: [place];  Service: Orthopedics;  Laterality: Right;  . Joint replacement  10-,2012    left hip  . Total knee arthroplasty  04/18/2012    Procedure: TOTAL KNEE ARTHROPLASTY;  Surgeon: Sharmon Revere, MD;  Location: [place];  Service: Orthopedics;  Laterality: Right;  . Knee arthroplasty Left 05/31/2015    Procedure: COMPUTER ASSISTED TOTAL KNEE ARTHROPLASTY;  Surgeon: Marybelle Killings, MD;  Location: [place];  Service: Orthopedics;  Laterality: Left;    There were no vitals filed for this visit.  Visit Diagnosis:  Left knee pain - Plan: PT plan of care cert/re-cert  Knee stiffness, left - Plan: PT plan of care cert/re-cert  Knee swelling, left - Plan: PT plan of care cert/re-cert  Left leg weakness - Plan: PT plan of care cert/re-cert  Abnormality of gait - Plan: PT plan of care  cert/re-cert      Subjective Assessment - 06/24/15 0856    Subjective pt is a 64 y.o M s/p L TKA on 06/04/2015. Since the surgery pt reports that things are going pretty good and has been doing home health physical therapy and has been continuing doing his exercises.    How long can you sit comfortably? 45 min   How long can you stand comfortably? 30 min   How long can you walk comfortably? 30 min   Diagnostic tests 06/18/2015 x-ray per pt report everything looked good    Patient Stated Goals to get more range back and decrease stiffness, get stronger.    Currently in Pain? Yes   Pain Score 5   last took pain medication at 7:30 am   Pain Location Knee   Pain Orientation Left   Pain Descriptors / Indicators Stabbing   Pain Type Surgical pain   Pain Onset More than a month ago   Pain Frequency Intermittent   Aggravating Factors  prolonged sitting, prolonged walking/standing, laying down in bed, proper positioning   Pain Relieving Factors pain medication, relaxing            [facility] PT Assessment - 06/24/15 0900    Assessment   Medical Diagnosis s/p L TKA   Referring Provider Rodell Perna   Onset Date/Surgical Date 06/04/15  Hand Dominance Right   Next MD Visit 07/18/2015   Prior Therapy yes   Precautions   Precautions None   Restrictions   Weight Bearing Restrictions No   Balance Screen   Has the patient fallen in the past 6 months No   Has the patient had a decrease in activity level because of a fear of falling?  No   Is the patient reluctant to leave their home because of a fear of falling?  No   Home Environment   Living Environment Private residence   Living Arrangements Spouse/significant other   Available Help at Discharge Available 24 hours/day;Available PRN/intermittently   Type of Home House   Home Access Stairs to enter   Entrance Stairs-Number of Steps 1   Entrance Stairs-Rails None   Home Layout Two level   Alternate Level Stairs-Number of Steps 13    Alternate Level Stairs-Rails Right   Home Equipment Hyrum - single point;Walker - 2 wheels;Crutches   Prior Function   Level of Independence Independent;Independent with basic ADLs   Vocation Retired   Leisure fishing, sports   Cognition   Overall Cognitive Status Within Functional Limits for tasks assessed   Observation/Other Assessments   Observations incision site appears clean, intact and healing well   Focus on Therapeutic Outcomes (FOTO)  52% limited  Predicted 36% limited   Observation/Other Assessments-Edema    Edema Circumferential  pitting edema noted inferor to the tibiofemoral joint   Circumferential Edema   Circumferential - Left  At joint line 47cm, 10 cm 49.8 above, 10 cm below 40cm   Posture/Postural Control   Posture/Postural Control Postural limitations   ROM / Strength   AROM / PROM / Strength AROM;PROM;Strength   AROM   AROM Assessment Site Knee   Right/Left Knee Right;Left   Right Knee Extension 0   Right Knee Flexion 115   Left Knee Extension -8   Left Knee Flexion 88   PROM   PROM Assessment Site Knee   Right/Left Knee Right;Left   Left Knee Extension -5   Left Knee Flexion 95   Strength   Strength Assessment Site Knee   Right/Left Knee Right;Left   Right Knee Flexion 4+/5   Right Knee Extension 4+/5   Left Knee Flexion 4/5  pain during testin   Left Knee Extension 4/5   Palpation   Palpation comment tenderness located around the incision site and globally around the knee   Ambulation/Gait   Gait Pattern Step-through pattern;Decreased stride length;Decreased stance time - left;Decreased step length - right;Antalgic                           PT Education - 06/24/15 0926    Education provided Yes   Education Details evaluation findings, POC, goals, HEP    Person(s) Educated Patient   Methods Explanation   Comprehension Verbalized understanding          PT Short Term Goals - 06/24/15 0930    PT SHORT TERM GOAL #1    Title pt will be I with inital HEP ( 07/15/2015)   Time 3   Period Weeks   Status New   PT SHORT TERM GOAL #2   Title pt will be able to verbalize and demostrate techniques to reduce L knee pain and inflammation via RICE (07/15/2015)   Time 3   Period Weeks   Status New           PT Long  Term Goals - 07-11-15 0931    PT LONG TERM GOAL #1   Title pt will be I with all HEP as of last visit ( 08/05/2015)   Time 6   Period Weeks   Status New   PT LONG TERM GOAL #2   Title pt will demonatrate increased knee flexion to > 120 degrees witn < 3/10 pain to assist with functional and efficent gait pattern (08/05/2015)   Time 6   Period Weeks   Status New   PT LONG TERM GOAL #3   Title pt will demonstrate increased L knee strength to >/= 4+5 with < 3/10 pain to assist with walking and standing endurance (08/05/2015)    Time 6   Period Weeks   Status New   PT LONG TERM GOAL #4   Title pt will be able to tolerate standing and walking for > 45 minutes and maintain Single leg balance for > 15 sec wot assist with balance and endurance (08/05/2015)   Time 6   Period Weeks   Status New   PT LONG TERM GOAL #5   Title pt will increase his FOTO score to >/= 60 to demonstrate improved function at discharge (08/05/2015)   Time [place] - 07-11-15 0926    Clinical Impression Statement Devrin present to O[facility] s/p L TKA on 06/04/2015 and has been previously seen at home health. He demonstrates limited AROM of the L knee with pain noted at end ranges. He demonstrates miled limitation of strength in his available ROM. palpation revealed tendenres globally in the knee with noted pitting edema inferior to the knee joint.  He would benefit from physical therapy to improve ROM and strength and decrease pain by addressing the impairments listed.    Pt will benefit from skilled therapeutic intervention in order to improve on the following deficits Pain;Improper  body mechanics;Postural dysfunction;Impaired flexibility;Hypomobility;Decreased balance;Increased edema;Decreased strength;Decreased activity tolerance;Decreased endurance;Difficulty walking;Decreased range of motion   Rehab Potential Good   PT Frequency 2x / week   PT Duration 6 weeks   PT Treatment/Interventions Vasopneumatic Device;Taping;Dry needling;Passive range of motion;Patient/family education;Manual techniques;Therapeutic exercise;Therapeutic activities;Iontophoresis 4mg /ml Dexamethasone;Moist Heat;Electrical Stimulation;ADLs/Self Care Home Management;Ultrasound;Cryotherapy;Neuromuscular re-education;Gait training   PT Next Visit Plan assess response to HEP, knee mobs, CKC strengthening, balance training, Game ready   PT Home Exercise Plan standing hip extension/abduction, seated LAQ with band and hamstring curls with band, and prone quad stretch with strap   Consulted and Agree with Plan of Care Patient          G-Codes - July 11, 2015 0936    Functional Assessment Tool Used FOTO 52% limited   Functional Limitation Mobility: Walking and moving around   Mobility: Walking and Moving Around Current Status [phone]) At least 40 percent but less than 60 percent impaired, limited or restricted   Mobility: Walking and Moving Around Goal Status [phone]) At least 20 percent but less than 40 percent impaired, limited or restricted       Problem List Patient Active Problem List   Diagnosis Date Noted  . Rheumatoid arthritis involving both knees ([place]) 05/31/2015  . 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

## 2015-06-24 NOTE — Patient Instructions (Signed)
   Daylani Deblois PT, DPT, LAT, ATC  [facility] Phone: [phone]     

## 2015-07-02 ENCOUNTER — Ambulatory Visit: Payer: Commercial Managed Care - HMO | Admitting: Physical Therapy

## 2015-07-02 DIAGNOSIS — M25462 Effusion, left knee: Secondary | ICD-10-CM

## 2015-07-02 DIAGNOSIS — M25562 Pain in left knee: Secondary | ICD-10-CM

## 2015-07-02 DIAGNOSIS — M25662 Stiffness of left knee, not elsewhere classified: Secondary | ICD-10-CM

## 2015-07-02 DIAGNOSIS — R269 Unspecified abnormalities of gait and mobility: Secondary | ICD-10-CM

## 2015-07-02 DIAGNOSIS — R29898 Other symptoms and signs involving the musculoskeletal system: Secondary | ICD-10-CM

## 2015-07-02 NOTE — Therapy (Signed)
[place], [place], [address] Phone: [phone]   Fax:  [phone]  Physical Therapy Treatment  Patient Details  Name: Jeffery Price MRN: [id number] Date of Birth: Sep 30, 1950 Referring Provider: Rodell Perna  Encounter Date: 07/02/2015      PT End of Session - 07/02/15 0939    Visit Number 2   Number of Visits 12   Date for PT Re-Evaluation 08/05/15   Authorization Type Medicare, Kx mod by 15th visit, and progress note by 9th visit   PT Start Time 0853   PT Stop Time 0931   PT Time Calculation (min) 38 min      Past Medical History  Diagnosis Date  . Hypertension     not taking midication due to expense  . Arthritis     osteoarthritis  . Swelling of joint, knee, right     Past Surgical History  Procedure Laterality Date  . Knee ligament reconstruction  1969    left knee  . Colonoscopy    . Knee arthroscopy  01/04/2012    Procedure: ARTHROSCOPY KNEE;  Surgeon: Sharmon Revere, MD;  Location: [place];  Service: Orthopedics;  Laterality: Right;  . Joint replacement  10-,2012    left hip  . Total knee arthroplasty  04/18/2012    Procedure: TOTAL KNEE ARTHROPLASTY;  Surgeon: Sharmon Revere, MD;  Location: [place];  Service: Orthopedics;  Laterality: Right;  . Knee arthroplasty Left 05/31/2015    Procedure: COMPUTER ASSISTED TOTAL KNEE ARTHROPLASTY;  Surgeon: Marybelle Killings, MD;  Location: [place];  Service: Orthopedics;  Laterality: Left;    There were no vitals filed for this visit.  Visit Diagnosis:  Left knee pain  Knee stiffness, left  Knee swelling, left  Left leg weakness  Abnormality of gait      Subjective Assessment - 07/02/15 0857    Subjective The weather has me hurting   Currently in Pain? Yes   Pain Score 6    Pain Location Knee   Pain Orientation Left   Pain Descriptors / Indicators Aching   Aggravating Factors  prolonged sitting standing or walking   Pain Relieving  Factors pain meds, relaxing, change positions            [facility] PT Assessment - 07/02/15 0906    AROM   Left Knee Extension -5   Left Knee Flexion 92                     [facility] Adult PT Treatment/Exercise - 07/02/15 0001    Knee/Hip Exercises: Stretches   Knee: Self-Stretch to increase Flexion 3 reps;30 seconds   Knee/Hip Exercises: Aerobic   Nustep L4 LE only x 6 min   Knee/Hip Exercises: Standing   Forward Step Up 20 reps;Step Height: 4";Step Height: 6"   SLS 5 sec right 8 sec left  several trials   Knee/Hip Exercises: Seated   Long Arc Quad Left;2 sets;20 reps   Long Arc Quad Limitations green theraband   Hamstring Curl 15 reps   Hamstring Limitations green theraband   Knee/Hip Exercises: Supine   Straight Leg Raises 10 reps                  PT Short Term Goals - 07/02/15 0926    PT SHORT TERM GOAL #1   Title pt will be I with inital HEP ( 07/15/2015)   Time 3   Period Weeks  Status On-going   PT SHORT TERM GOAL #2   Title pt will be able to verbalize and demostrate techniques to reduce L knee pain and inflammation via RICE (07/15/2015)   Time 3   Period Weeks   Status On-going           PT Long Term Goals - 07/02/15 0934    PT LONG TERM GOAL #1   Title pt will be I with all HEP as of last visit ( 08/05/2015)   Time 6   Period Weeks   Status On-going   PT LONG TERM GOAL #2   Title pt will demonatrate increased knee flexion to > 120 degrees witn < 3/10 pain to assist with functional and efficent gait pattern (08/05/2015)   Time 6   Period Weeks   Status On-going   PT LONG TERM GOAL #3   Title pt will demonstrate increased L knee strength to >/= 4+5 with < 3/10 pain to assist with walking and standing endurance (08/05/2015)    Time 6   Period Weeks   Status On-going   PT LONG TERM GOAL #4   Title pt will be able to tolerate standing and walking for > 45 minutes and maintain Single leg balance for > 15 sec wot assist with balance  and endurance (08/05/2015)   Time 6   Period Weeks   Status On-going   PT LONG TERM GOAL #5   Title pt will increase his FOTO score to >/= 60 to demonstrate improved function at discharge (08/05/2015)   Time 6   Period Weeks   Status On-going               Plan - 07/02/15 0935    Clinical Impression Statement Pt demonstrates improved knee flexion and extension ROM. Good quad contraction. Progressed to step ups and SLS. Pt reports no increased pain with therex and declined ice.    PT Next Visit Plan assess response to HEP, knee mobs, CKC strengthening, balance training, Game ready        Problem List Patient Active Problem List   Diagnosis Date Noted  . Rheumatoid arthritis involving both knees ([place]) 05/31/2015  . Total knee replacement status 05/31/2015    Dorene Ar, PTA 07/02/2015, 9:39 AM  [facility] [address] [place] [address] [place], [place], [address] Phone: [phone]   Fax:  [phone]  Name: Jeffery Price MRN: [id number] Date of Birth: [birth year]

## 2015-07-05 ENCOUNTER — Ambulatory Visit: Payer: Commercial Managed Care - HMO

## 2015-07-05 DIAGNOSIS — R29898 Other symptoms and signs involving the musculoskeletal system: Secondary | ICD-10-CM

## 2015-07-05 DIAGNOSIS — M25662 Stiffness of left knee, not elsewhere classified: Secondary | ICD-10-CM

## 2015-07-05 DIAGNOSIS — M25562 Pain in left knee: Secondary | ICD-10-CM | POA: Diagnosis not present

## 2015-07-05 DIAGNOSIS — M25462 Effusion, left knee: Secondary | ICD-10-CM

## 2015-07-05 DIAGNOSIS — R269 Unspecified abnormalities of gait and mobility: Secondary | ICD-10-CM

## 2015-07-05 NOTE — Therapy (Signed)
[place], [place], [address] Phone: [phone]   Fax:  [phone]  Physical Therapy Treatment  Patient Details  Name: Jeffery Price MRN: [id number] Date of Birth: Dec 06, 1950 Referring Provider: Rodell Perna  Encounter Date: 07/05/2015      PT End of Session - 07/05/15 0829    Visit Number 3   Number of Visits 12   Date for PT Re-Evaluation 08/05/15   PT Start Time 0815  Pt late 15 min   PT Stop Time 0900   PT Time Calculation (min) 45 min   Activity Tolerance Patient tolerated treatment well   Behavior During Therapy [facility] for tasks assessed/performed      Past Medical History  Diagnosis Date  . Hypertension     not taking midication due to expense  . Arthritis     osteoarthritis  . Swelling of joint, knee, right     Past Surgical History  Procedure Laterality Date  . Knee ligament reconstruction  1969    left knee  . Colonoscopy    . Knee arthroscopy  01/04/2012    Procedure: ARTHROSCOPY KNEE;  Surgeon: Sharmon Revere, MD;  Location: [place];  Service: Orthopedics;  Laterality: Right;  . Joint replacement  10-,2012    left hip  . Total knee arthroplasty  04/18/2012    Procedure: TOTAL KNEE ARTHROPLASTY;  Surgeon: Sharmon Revere, MD;  Location: [place];  Service: Orthopedics;  Laterality: Right;  . Knee arthroplasty Left 05/31/2015    Procedure: COMPUTER ASSISTED TOTAL KNEE ARTHROPLASTY;  Surgeon: Marybelle Killings, MD;  Location: [place];  Service: Orthopedics;  Laterality: Left;    There were no vitals filed for this visit.  Visit Diagnosis:  Left knee pain  Knee stiffness, left  Knee swelling, left  Left leg weakness  Abnormality of gait      Subjective Assessment - 07/05/15 0816    Subjective Weather working on me. 7/10 pain. He reports doing exercises 1-2x/day.    Currently in Pain? Yes   Pain Score 7    Pain Location Knee   Pain Orientation Left   Pain Descriptors /  Indicators Aching   Pain Type Surgical pain   Pain Onset More than a month ago   Pain Frequency Constant   Aggravating Factors  Prolonged sitting ,standing ,, walking   Pain Relieving Factors pain ,meds , change postions   Multiple Pain Sites No            [facility] PT Assessment - 07/05/15 0823    Circumferential Edema   Circumferential - Left  at joint line 45 cm, 10 cm above46  10 cm below 38 cm   AROM   Left Knee Extension -12   Left Knee Flexion 98                     [facility] Adult PT Treatment/Exercise - 07/05/15 0817    Knee/Hip Exercises: Aerobic   Nustep L4 LE only x 6 min   Knee/Hip Exercises: Supine   Short Arc Quad Sets Left;20 reps   Short Arc Quad Sets Limitations 5pounds   Straight Leg Raises Left;3 sets   Knee Extension Left;15 reps   Knee Extension Limitations legs on ball    Modalities   Modalities Vasopneumatic   Vasopneumatic   Number Minutes Vasopneumatic  15 minutes   Vasopnuematic Location  Knee   Vasopneumatic Pressure Medium   Vasopneumatic Temperature  36  PT Short Term Goals - 07/05/15 0843    PT SHORT TERM GOAL #2   Title pt will be able to verbalize and demostrate techniques to reduce L knee pain and inflammation via RICE (07/15/2015)   Status Achieved           PT Long Term Goals - 07/02/15 0934    PT LONG TERM GOAL #1   Title pt will be I with all HEP as of last visit ( 08/05/2015)   Time 6   Period Weeks   Status On-going   PT LONG TERM GOAL #2   Title pt will demonatrate increased knee flexion to > 120 degrees witn < 3/10 pain to assist with functional and efficent gait pattern (08/05/2015)   Time 6   Period Weeks   Status On-going   PT LONG TERM GOAL #3   Title pt will demonstrate increased L knee strength to >/= 4+5 with < 3/10 pain to assist with walking and standing endurance (08/05/2015)    Time 6   Period Weeks   Status On-going   PT LONG TERM GOAL #4   Title pt will be able to  tolerate standing and walking for > 45 minutes and maintain Single leg balance for > 15 sec wot assist with balance and endurance (08/05/2015)   Time 6   Period Weeks   Status On-going   PT LONG TERM GOAL #5   Title pt will increase his FOTO score to >/= 60 to demonstrate improved function at discharge (08/05/2015)   Time 6   Period Weeks   Status On-going               Plan - 07/05/15 0830    Clinical Impression Statement Progressing nicely but I encouraged him to stretch 3x/day to improve range as quickly as able to be able to max flexibility .      PT Next Visit Plan , knee mobs, CKC strengthening, balance training, Game ready   Consulted and Agree with Plan of Care Patient        Problem List Patient Active Problem List   Diagnosis Date Noted  . Rheumatoid arthritis involving both knees ([place]) 05/31/2015  . Total knee replacement status 05/31/2015    Darrel Hoover PT 07/05/2015, 8:45 AM  [facility] [address] [place], [place], [address] Phone: [phone]   Fax:  [phone]  Name: Jeffery Price MRN: [id number] Date of Birth: 05-17-1951

## 2015-07-06 ENCOUNTER — Ambulatory Visit: Payer: Commercial Managed Care - HMO | Admitting: Physical Therapy

## 2015-07-06 DIAGNOSIS — M25462 Effusion, left knee: Secondary | ICD-10-CM

## 2015-07-06 DIAGNOSIS — R269 Unspecified abnormalities of gait and mobility: Secondary | ICD-10-CM

## 2015-07-06 DIAGNOSIS — M25662 Stiffness of left knee, not elsewhere classified: Secondary | ICD-10-CM

## 2015-07-06 DIAGNOSIS — M25562 Pain in left knee: Secondary | ICD-10-CM

## 2015-07-06 DIAGNOSIS — R29898 Other symptoms and signs involving the musculoskeletal system: Secondary | ICD-10-CM

## 2015-07-06 NOTE — Therapy (Signed)
[place], [place], [address] Phone: [phone]   Fax:  [phone]  Physical Therapy Treatment  Patient Details  Name: Jeffery Price MRN: [id number] Date of Birth: 1951-01-04 Referring Provider: Rodell Perna  Encounter Date: 07/06/2015      PT End of Session - 07/06/15 1341    Visit Number 4   Number of Visits 12   Date for PT Re-Evaluation 08/05/15   PT Start Time 1330   PT Stop Time 1430   PT Time Calculation (min) 60 min      Past Medical History  Diagnosis Date  . Hypertension     not taking midication due to expense  . Arthritis     osteoarthritis  . Swelling of joint, knee, right     Past Surgical History  Procedure Laterality Date  . Knee ligament reconstruction  1969    left knee  . Colonoscopy    . Knee arthroscopy  01/04/2012    Procedure: ARTHROSCOPY KNEE;  Surgeon: Sharmon Revere, MD;  Location: [facility];  Service: Orthopedics;  Laterality: Right;  . Joint replacement  10-,2012    left hip  . Total knee arthroplasty  04/18/2012    Procedure: TOTAL KNEE ARTHROPLASTY;  Surgeon: Sharmon Revere, MD;  Location: [facility];  Service: Orthopedics;  Laterality: Right;  . Knee arthroplasty Left 05/31/2015    Procedure: COMPUTER ASSISTED TOTAL KNEE ARTHROPLASTY;  Surgeon: Marybelle Killings, MD;  Location: [facility];  Service: Orthopedics;  Laterality: Left;    There were no vitals filed for this visit.  Visit Diagnosis:  Left knee pain  Knee stiffness, left  Knee swelling, left  Left leg weakness  Abnormality of gait      Subjective Assessment - 07/06/15 1338    Subjective I'm feeling pretty good just the regular aches and pains and stiffness.   How long can you sit comfortably? 45 min   How long can you stand comfortably? 30 min   How long can you walk comfortably? 30 min   Patient Stated Goals to get more range back and decrease stiffness, get stronger.    Pain Score 6    Multiple  Pain Sites No            [facility] PT Assessment - 07/06/15 0001    AROM   Left Knee Extension -11   Left Knee Flexion 103                     [facility] Adult PT Treatment/Exercise - 07/06/15 1427    Knee/Hip Exercises: Stretches   Other Knee/Hip Stretches Hip flexor stretch over edge of mat with active stretch into knee flexion 3 reps 30 second holds; Both with ant/inferior patellor mobs   active hamstring stretch 2 reps x 30 seconds     Other Knee/Hip Stretches standing hamstring and quad stretch 3 reps x 30 second holds each   Knee/Hip Exercises: Aerobic   Nustep L5 LE only x 6 min   Knee/Hip Exercises: Machines for Strengthening   Cybex Knee Flexion 2 plates 2 sets 15 reps   Knee/Hip Exercises: Standing   Forward Step Up 20 reps;Step Height: 6"   Other Standing Knee Exercises Marches x 20 reps;  mini squats x 15 reps;  sit to stands x 10 reps;  Heel and toe raises x 20 reps each   Vasopneumatic   Number Minutes Vasopneumatic  15 minutes   Vasopnuematic Location  Knee   Vasopneumatic Pressure Medium   Vasopneumatic Temperature  32                  PT Short Term Goals - 07/05/15 0843    PT SHORT TERM GOAL #2   Title pt will be able to verbalize and demostrate techniques to reduce L knee pain and inflammation via RICE (07/15/2015)   Status Achieved           PT Long Term Goals - 07/02/15 0934    PT LONG TERM GOAL #1   Title pt will be I with all HEP as of last visit ( 08/05/2015)   Time 6   Period Weeks   Status On-going   PT LONG TERM GOAL #2   Title pt will demonatrate increased knee flexion to > 120 degrees witn < 3/10 pain to assist with functional and efficent gait pattern (08/05/2015)   Time 6   Period Weeks   Status On-going   PT LONG TERM GOAL #3   Title pt will demonstrate increased L knee strength to >/= 4+5 with < 3/10 pain to assist with walking and standing endurance (08/05/2015)    Time 6   Period Weeks   Status On-going    PT LONG TERM GOAL #4   Title pt will be able to tolerate standing and walking for > 45 minutes and maintain Single leg balance for > 15 sec wot assist with balance and endurance (08/05/2015)   Time 6   Period Weeks   Status On-going   PT LONG TERM GOAL #5   Title pt will increase his FOTO score to >/= 60 to demonstrate improved function at discharge (08/05/2015)   Time 6   Period Weeks   Status On-going               Plan - 07/06/15 1342    Clinical Impression Statement Patient still reports stiffness so treatment was focused on stretching and CKC LE strengthening with no increase in pain noted. Patient said he has been excercising at home and using stretch bands. Swelling and tenderness noted in the left knee so Vasopneumatic was used at the end of Tx to control swelling and reduce pain. Left knee ROM was measured at 103 degress flexion and  -11 degrees extension   PT Next Visit Plan , knee mobs, CKC strengthening, balance training, Game ready        Problem List Patient Active Problem List   Diagnosis Date Noted  . Rheumatoid arthritis involving both knees ([place]) 05/31/2015  . Total knee replacement status 05/31/2015   La[place], [place] 07/06/2015 2:33 PM PHONE:[phone] [facility] [place], [place], [address] Phone: [phone]   Fax:  [phone]  Name: Jeffery Price MRN: [id number] Date of Birth: 1950-10-07

## 2015-07-13 ENCOUNTER — Ambulatory Visit: Payer: Commercial Managed Care - HMO | Admitting: Physical Therapy

## 2015-07-13 DIAGNOSIS — M25462 Effusion, left knee: Secondary | ICD-10-CM

## 2015-07-13 DIAGNOSIS — R269 Unspecified abnormalities of gait and mobility: Secondary | ICD-10-CM

## 2015-07-13 DIAGNOSIS — M25662 Stiffness of left knee, not elsewhere classified: Secondary | ICD-10-CM

## 2015-07-13 DIAGNOSIS — M25562 Pain in left knee: Secondary | ICD-10-CM | POA: Diagnosis not present

## 2015-07-13 DIAGNOSIS — R29898 Other symptoms and signs involving the musculoskeletal system: Secondary | ICD-10-CM

## 2015-07-13 NOTE — Therapy (Addendum)
[place], [place], [address] Phone: [phone]   Fax:  [phone]  Physical Therapy Treatment / Discharge Note  Patient Details  Name: Jeffery Price MRN: [id number] Date of Birth: 1951/06/18 Referring Provider: Rodell Perna  Encounter Date: 07/13/2015      PT End of Session - 07/13/15 0854    Visit Number 5   Number of Visits 12   Date for PT Re-Evaluation 08/05/15   PT Start Time 0845   PT Stop Time 0945   PT Time Calculation (min) 60 min      Past Medical History  Diagnosis Date  . Hypertension     not taking midication due to expense  . Arthritis     osteoarthritis  . Swelling of joint, knee, right     Past Surgical History  Procedure Laterality Date  . Knee ligament reconstruction  1969    left knee  . Colonoscopy    . Knee arthroscopy  01/04/2012    Procedure: ARTHROSCOPY KNEE;  Surgeon: Sharmon Revere, MD;  Location: [facility];  Service: Orthopedics;  Laterality: Right;  . Joint replacement  10-,2012    left hip  . Total knee arthroplasty  04/18/2012    Procedure: TOTAL KNEE ARTHROPLASTY;  Surgeon: Sharmon Revere, MD;  Location: [facility];  Service: Orthopedics;  Laterality: Right;  . Knee arthroplasty Left 05/31/2015    Procedure: COMPUTER ASSISTED TOTAL KNEE ARTHROPLASTY;  Surgeon: Marybelle Killings, MD;  Location: [facility];  Service: Orthopedics;  Laterality: Left;    There were no vitals filed for this visit.  Visit Diagnosis:  No diagnosis found.      Subjective Assessment - 07/13/15 0857    Subjective "I've been going swimming a lot and doing the whirlpool at the gym" "I've been walking a mile a day"   Currently in Pain? Yes   Pain Score 3    Pain Location Knee   Pain Orientation Left   Pain Descriptors / Indicators Aching   Pain Type Surgical pain   Pain Frequency Constant   Aggravating Factors  prolonged sitting, standing   Pain Relieving Factors pain meds, changing positions             [facility] PT Assessment - 07/13/15 0001    AROM   Left Knee Extension -11   Left Knee Flexion 110                     [facility] Adult PT Treatment/Exercise - 07/13/15 1512    Knee/Hip Exercises: Stretches   Other Knee/Hip Stretches Hip flexor stretch over edge of mat with active stretch into knee flexion 3 reps 30 second holds; active hamstring stretch 2 reps x 30 seconds  with ant/inferior patellor mobs   Other Knee/Hip Stretches standing hamstring and quad stretch 3 reps x 30 second holds each   Knee/Hip Exercises: Aerobic   Nustep L5 LE only x 6 min   Knee/Hip Exercises: Standing   Forward Step Up 20 reps;Step Height: 6"   Other Standing Knee Exercises Marches x 20 reps; mini squats x 15 reps; sit to stands x 10 reps; Heel and toe raises x 20 reps each   Other Standing Knee Exercises Hip 3 way 20 reps   Modalities   Modalities Vasopneumatic                  PT Short Term Goals - 07/13/15 0857    PT SHORT  TERM GOAL #1   Title pt will be I with inital HEP ( 07/15/2015)   Time 3   Period Weeks   Status Achieved   PT SHORT TERM GOAL #2   Title pt will be able to verbalize and demostrate techniques to reduce L knee pain and inflammation via RICE (07/15/2015)   Time 3   Period Weeks   Status Achieved            PT Long Term Goals - 07/13/15 0762    PT LONG TERM GOAL #1   Title pt will be I with all HEP as of last visit ( 08/05/2015)   Time 6   Period Weeks   Status On-going   PT LONG TERM GOAL #4   Title pt will be able to tolerate standing and walking for > 45 minutes and maintain Single leg balance for > 15 sec wot assist with balance and endurance (08/05/2015)   Time 6   Period Weeks   Status Partially Met  can walk for over 45 minutes. & sec SLS               Plan - 07/13/15 0901    Clinical Impression Statement Patient has been working hard at home and at the gym and is independent with his HEP. His pain has decreased  since last section but is still having some pain and swelling so the vasopneumatic was used. Iniated hip 3way               PT Next Visit Plan , knee mobs, CKC strengthening, balance training, Game ready        Problem List Patient Active Problem List   Diagnosis Date Noted  . Rheumatoid arthritis involving both knees ([place]) 05/31/2015  . 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   Education / Equipment: HEP   Plan:                                                    Patient goals were not met. Patient is being discharged due to not returning since the last visit.  ?????        Kristoffer Leamon PT, DPT, LAT, ATC  09/01/2015  8:55 AM

## 2015-07-15 ENCOUNTER — Ambulatory Visit: Payer: Commercial Managed Care - HMO | Attending: Orthopaedic Surgery | Admitting: Physical Therapy

## 2015-07-20 ENCOUNTER — Ambulatory Visit: Payer: Commercial Managed Care - HMO | Admitting: Physical Therapy

## 2015-07-22 ENCOUNTER — Encounter: Payer: Medicare Other | Admitting: Physical Therapy

## 2015-07-26 ENCOUNTER — Ambulatory Visit: Payer: Commercial Managed Care - HMO | Admitting: Physical Therapy

## 2015-07-28 ENCOUNTER — Ambulatory Visit: Payer: Commercial Managed Care - HMO | Admitting: Physical Therapy

## 2015-07-28 ENCOUNTER — Telehealth: Payer: Self-pay | Admitting: Physical Therapy

## 2015-07-28 NOTE — Telephone Encounter (Signed)
LVM regarding missing the last 2 visits and having no more scheduled visits to call back and schedule visits or if he feels like he no longer requires PT that we can discharge him.

## 2015-10-05 ENCOUNTER — Other Ambulatory Visit (HOSPITAL_COMMUNITY): Payer: Self-pay | Admitting: Orthopaedic Surgery

## 2015-10-05 DIAGNOSIS — R52 Pain, unspecified: Secondary | ICD-10-CM

## 2015-10-06 ENCOUNTER — Ambulatory Visit (HOSPITAL_COMMUNITY)
Admission: RE | Admit: 2015-10-06 | Discharge: 2015-10-06 | Disposition: A | Discharge status: Home / Self Care | Payer: Commercial Managed Care - HMO | Source: Ambulatory Visit | Attending: Family Medicine | Admitting: Family Medicine

## 2015-10-06 DIAGNOSIS — M79609 Pain in unspecified limb: Secondary | ICD-10-CM | POA: Diagnosis not present

## 2015-10-06 DIAGNOSIS — R52 Pain, unspecified: Secondary | ICD-10-CM

## 2015-10-06 DIAGNOSIS — M7989 Other specified soft tissue disorders: Secondary | ICD-10-CM | POA: Insufficient documentation

## 2015-10-06 NOTE — Progress Notes (Signed)
VASCULAR LAB PRELIMINARY  PRELIMINARY  PRELIMINARY  PRELIMINARY  Bilateral lower extremity venous duplex completed.    Preliminary report:  Bilateral:  No evidence of DVT, superficial thrombosis, or Baker's Cyst.   Janifer Adie, RVT, RDMS 10/06/2015, 3:36 PM

## 2016-07-21 ENCOUNTER — Telehealth (INDEPENDENT_AMBULATORY_CARE_PROVIDER_SITE_OTHER): Payer: Self-pay | Admitting: Orthopaedic Surgery

## 2016-07-21 NOTE — Telephone Encounter (Signed)
Pt calling asking for a new handicap sign. Stated the one he has is expired. Pt number is  [phone]

## 2016-07-25 NOTE — Telephone Encounter (Signed)
Ok thanks 

## 2016-07-25 NOTE — Telephone Encounter (Signed)
Advised placard is ready for pick up

## 2016-07-25 NOTE — Telephone Encounter (Signed)
Is this OK?

## 2016-08-23 DIAGNOSIS — H401131 Primary open-angle glaucoma, bilateral, mild stage: Secondary | ICD-10-CM | POA: Diagnosis not present

## 2016-08-23 DIAGNOSIS — H2513 Age-related nuclear cataract, bilateral: Secondary | ICD-10-CM | POA: Diagnosis not present

## 2016-08-23 DIAGNOSIS — H17821 Peripheral opacity of cornea, right eye: Secondary | ICD-10-CM | POA: Diagnosis not present

## 2016-08-23 DIAGNOSIS — H353132 Nonexudative age-related macular degeneration, bilateral, intermediate dry stage: Secondary | ICD-10-CM | POA: Diagnosis not present

## 2016-09-18 DIAGNOSIS — H2511 Age-related nuclear cataract, right eye: Secondary | ICD-10-CM | POA: Diagnosis not present

## 2016-12-05 DIAGNOSIS — M1990 Unspecified osteoarthritis, unspecified site: Secondary | ICD-10-CM | POA: Diagnosis not present

## 2016-12-05 DIAGNOSIS — D430 Neoplasm of uncertain behavior of brain, supratentorial: Secondary | ICD-10-CM | POA: Diagnosis not present

## 2016-12-05 DIAGNOSIS — I674 Hypertensive encephalopathy: Secondary | ICD-10-CM | POA: Diagnosis not present

## 2016-12-05 DIAGNOSIS — I10 Essential (primary) hypertension: Secondary | ICD-10-CM | POA: Diagnosis not present

## 2016-12-11 ENCOUNTER — Other Ambulatory Visit: Payer: Self-pay | Admitting: Family Medicine

## 2016-12-11 DIAGNOSIS — I674 Hypertensive encephalopathy: Secondary | ICD-10-CM

## 2016-12-12 DIAGNOSIS — I10 Essential (primary) hypertension: Secondary | ICD-10-CM | POA: Diagnosis not present

## 2016-12-12 DIAGNOSIS — M130 Polyarthritis, unspecified: Secondary | ICD-10-CM | POA: Diagnosis not present

## 2016-12-12 DIAGNOSIS — E039 Hypothyroidism, unspecified: Secondary | ICD-10-CM | POA: Diagnosis not present

## 2016-12-20 ENCOUNTER — Ambulatory Visit (INDEPENDENT_AMBULATORY_CARE_PROVIDER_SITE_OTHER): Payer: Medicare Other | Admitting: Neurology

## 2016-12-20 ENCOUNTER — Encounter: Payer: Self-pay | Admitting: Neurology

## 2016-12-20 VITALS — BP 156/84 | HR 71 | Ht 68.0 in | Wt 204.0 lb

## 2016-12-20 DIAGNOSIS — G454 Transient global amnesia: Secondary | ICD-10-CM

## 2016-12-20 DIAGNOSIS — R413 Other amnesia: Secondary | ICD-10-CM | POA: Diagnosis not present

## 2016-12-20 NOTE — Patient Instructions (Signed)
I had a long discussion the patient with his episode of transient confusion and memory loss and wandering day likely representing transient global amnesia versus TIA complex partial seizure. I recommend checking MRI scan the brain, MRA of the brain and neck, EEG. Continue aspirin for stroke prevention. She will return for follow-up in 2 months or call earlier if necessary  Transient Global Amnesia Transient global amnesia causes a sudden and temporary (transient) loss of memory (amnesia). While you may recall memories from your distant past, including being able to recognize people you know well, you may not recall things that happened more recently in the past days, months, or even year. A transient global amnesia episode does not last longer than 24 hours. Transient global amnesia does not affect your other brain functions. Your memory usually returns to normal after an episode is over. One episode of transient global amnesia does not make you more likely to have a stroke, a relapse, or other complications. What are the causes? The cause of this condition is not known. What increases the risk? Transient global amnesia is more likely to develop in people who:  Are 6-67 years old.  Have a history of migraine headaches. What are the signs or symptoms? The main symptoms of this condition include:  The inability to remember recent events.  Asking repetitive questions about the situation and surroundings and not recalling the answers to these questions. Other symptoms include:  Restlessness and nervousness.  Confusion.  Headaches.  Dizziness.  Nausea. How is this diagnosed? Your health care provider may suspect transient global amnesia based on your symptoms. Your health care provider will do a physical exam. This may include a test to check your mental abilities (cognitive evaluation). You may also have imaging studies done to check your brain function. These may  include:  Electroencephalography (EEG).  Diffusion-weighted imaging (DWI).  MRI. How is this treated? There is no treatment for this condition. An episode typically goes away on its own after a few hours. If you also have a seizure or migraine during an episode, you will receive treatment for these conditions. This may include medicines. Follow these instructions at home:  Take medicines only as directed by your health care provider.  Tell your family or friends that you have transient global amnesia. Ask them to help you avoid physical exertion, including sexual intercourse, swimming, and straining while holding your breath (Valsalva maneuver), until the episode passes. These are events that can bring on transient global amnesia attacks. Contact a health care provider if:  You have a migraine and it does not go away after you have followed your treatment plan for this condition.  You have a seizure for the first time, or a seizure that is different from seizures you normally have.  You experience transient global amnesia repeatedly. This information is not intended to replace advice given to you by your health care provider. Make sure you discuss any questions you have with your health care provider. Document Released: 09/07/2004 Document Revised: 04/03/2016 Document Reviewed: 04/15/2014 Elsevier Interactive Patient Education  2017 [employer].

## 2016-12-20 NOTE — Progress Notes (Signed)
[facility] [address] [place]. [place] [address] [phone]       OFFICE CONSULT NOTE  Jeffery. Jeffery Price Date of Birth:  [birth year] Medical Record Number:  [id number]   Referring MD:  Iona Beard  Reason for Referral:  PossibleTIA  HPI: Jeffery Price is a pleasant 66 year old African-American male who had an episode of transient confusion and disorientation memory loss and getting lost on 11/30/16 by visiting [place] to attend his niece's funeral. Patient states he remembers driving down from [place] along with his family members. He was not feeling well during the journey and started complaining of a headache. He felt this may have been related to not being taking his blood pressure medication for a few weeks as he ran out of them. After reaching that he had 4-5 beers and then somehow he got separated from his family members and was lost for about a day. His family members reported him missing and put out a missing bullet in. The cough is found him wandering the streets. He complained of a headache and was given 2 aspirin by his sister. The patient states that since then he has had no problems remembering but he still cannot remember any details as to what he did and how he got lost during that 24-hour period family members stated that they have noticed some forgetfulness even for the preceding few months. Patient states that he is independent in activities of daily living and self-care. There has been no recent changes in his behavior or hobbies. He has no prior history of stroke, TIA, seizures or significant head injury with loss of consciousness. It is unclear to me as to whether he got any medical evaluation and testing done in [place]. He had some recent lab work done by his primary physician which shows normal competency metabolic panel, CBC. TSH was slightly elevated. MRI scan of the brain and carotid Dopplers have been ordered but not done yet. Urine  drug screen was negative. Patient was started on blood pressure medications during his primary care physician visit on 12/05/16 and he is doing much better. ROS:   14 system review of systems is positive for weight gain, fatigue, leg swelling, itching, nose, blurred vision, cough, wheezing, constipation, increased thirst, joint pain and swelling, aching muscles, runny nose, memory loss, confusion, headache, slurred speech, depression, anxiety, decreased energy, disinterest in activities, hallucinations, sleepiness and all other systems negative  PMH:  Past Medical History:  Diagnosis Date  . Arthritis    osteoarthritis  . Hypertension    not taking midication due to expense  . Swelling of joint, knee, right     Social History:  Social History   Social History  . Marital status: Married    Spouse name: N/A  . Number of children: N/A  . Years of education: N/A   Occupational History  . Not on file.   Social History Main Topics  . Smoking status: Former Smoker    Packs/day: 0.50    Years: 10.00    Types: Cigarettes    Quit date: 08/15/1979  . Smokeless tobacco: Never Used     Comment: quit smoking 1981  . Alcohol use 0.6 oz/week    1 Cans of beer per week     Comment: ocasional beer  . Drug use: No  . Sexual activity: Yes   Other Topics Concern  . Not on file   Social History Narrative  . No narrative on file  Medications:   Current Outpatient Prescriptions on File Prior to Visit  Medication Sig Dispense Refill  . amLODipine (NORVASC) 10 MG tablet Take 10 mg by mouth daily.    Marland Kitchen losartan (COZAAR) 100 MG tablet Take 100 mg by mouth daily.    Marland Kitchen tiZANidine (ZANAFLEX) 4 MG tablet Take 1 tablet (4 mg total) by mouth every 6 (six) hours as needed for muscle spasms. 30 tablet 1   No current facility-administered medications on file prior to visit.     Allergies:  No Known Allergies  Physical Exam General: well developed, well nourished middle aged African-American  male, seated, in no evident distress Head: head normocephalic and atraumatic.   Neck: supple with no carotid or supraclavicular bruits Cardiovascular: regular rate and rhythm, no murmurs Musculoskeletal: no deformity Skin:  no rash/petichiae Vascular:  Normal pulses all extremities  Neurologic Exam Mental Status: Awake and fully alert. Oriented to place and time. Recent and remote memory intact. Recall 3/3. Able to name 6 animals with four legs. Clock drawing 4/4 Attention span, concentration and fund of knowledge appropriate. Mood and affect appropriate.  Cranial Nerves: Fundoscopic exam reveals sharp disc margins. Pupils equal, briskly reactive to light. Extraocular movements full without nystagmus. Visual fields full to confrontation. Hearing intact. Facial sensation intact. Face, tongue, palate moves normally and symmetrically.  Motor: Normal bulk and tone. Normal strength in all tested extremity muscles. Sensory.: intact to touch , pinprick , position and vibratory sensation.  Coordination: Rapid alternating movements normal in all extremities. Finger-to-nose and heel-to-shin performed accurately bilaterally. Gait and Station: Arises from chair without difficulty. Stance is normal. Gait demonstrates normal stride length and balance . Able to heel, toe and tandem walk without difficulty.  Reflexes: 1+ and symmetric. Toes downgoing.   NIHSS  0 Modified Rankin  0   ASSESSMENT: 66 year old African-American male with transient episode of memory loss, confusion and wandering of unclear etiology. Possibilities include transient global amnesia versus complex partial seizure vertebrobasilar TIA.    PLAN: I had a long discussion the patient with his episode of transient confusion and memory loss and wandering day likely representing transient global amnesia versus TIA complex partial seizure. I recommend checking MRI scan the brain, MRA of the brain and neck, EEG. Continue aspirin for stroke  prevention. He will return for follow-up in 2 months or call earlier if necessary greater than 50% time during this 45 minute consultation visit was spent on counseling and coordination of care about episode of transient global amnesia versus TIA versus seizure and answering questions Antony Contras, MD  [facility] [address] [place], [place] [address]  Phone [phone] Fax [phone] Note: This document was prepared with digital dictation and possible smart phrase technology. Any transcriptional errors that result from this process are unintentional.

## 2016-12-21 ENCOUNTER — Telehealth: Payer: Self-pay

## 2016-12-21 NOTE — Telephone Encounter (Signed)
I spoke to Jeffery Price at [facility].. She said they are booked up but she will call him now and schedule the two new images.

## 2016-12-21 NOTE — Telephone Encounter (Signed)
Okay; thanks.

## 2016-12-21 NOTE — Telephone Encounter (Signed)
Patient and Dr.Sethi would like to have the MRA head and Mra neck schedule on Sunday with his other scan. It would be helpful for the patient if he can.

## 2016-12-24 ENCOUNTER — Ambulatory Visit
Admission: RE | Admit: 2016-12-24 | Discharge: 2016-12-24 | Disposition: A | Discharge status: Home / Self Care | Payer: Medicare Other | Source: Ambulatory Visit | Attending: Family Medicine | Admitting: Family Medicine

## 2016-12-24 DIAGNOSIS — I674 Hypertensive encephalopathy: Secondary | ICD-10-CM

## 2016-12-24 DIAGNOSIS — R413 Other amnesia: Secondary | ICD-10-CM | POA: Diagnosis not present

## 2016-12-24 DIAGNOSIS — R51 Headache: Secondary | ICD-10-CM | POA: Diagnosis not present

## 2016-12-24 MED ORDER — GADOBENATE DIMEGLUMINE 529 MG/ML IV SOLN
19.0000 mL | Freq: Once | INTRAVENOUS | Status: AC | PRN
  Administered 2016-12-24: 19 mL via INTRAVENOUS

## 2016-12-26 DIAGNOSIS — I10 Essential (primary) hypertension: Secondary | ICD-10-CM | POA: Diagnosis not present

## 2016-12-26 DIAGNOSIS — E039 Hypothyroidism, unspecified: Secondary | ICD-10-CM | POA: Diagnosis not present

## 2017-01-03 ENCOUNTER — Other Ambulatory Visit: Payer: Medicare Other

## 2017-01-04 ENCOUNTER — Other Ambulatory Visit: Payer: Medicare Other

## 2017-01-05 ENCOUNTER — Encounter: Payer: Self-pay | Admitting: Neurology

## 2017-01-17 ENCOUNTER — Ambulatory Visit
Admission: RE | Admit: 2017-01-17 | Discharge: 2017-01-17 | Disposition: A | Discharge status: Home / Self Care | Payer: Medicare Other | Source: Ambulatory Visit | Attending: Neurology | Admitting: Neurology

## 2017-01-17 DIAGNOSIS — G459 Transient cerebral ischemic attack, unspecified: Secondary | ICD-10-CM | POA: Diagnosis not present

## 2017-01-17 DIAGNOSIS — G454 Transient global amnesia: Secondary | ICD-10-CM | POA: Diagnosis not present

## 2017-01-17 DIAGNOSIS — R413 Other amnesia: Secondary | ICD-10-CM

## 2017-01-17 MED ORDER — GADOBENATE DIMEGLUMINE 529 MG/ML IV SOLN
19.0000 mL | Freq: Once | INTRAVENOUS | Status: AC | PRN
  Administered 2017-01-17: 19 mL via INTRAVENOUS

## 2017-01-20 ENCOUNTER — Telehealth: Payer: Self-pay | Admitting: Neurology

## 2017-01-20 NOTE — Telephone Encounter (Signed)
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 Since patient is asymptomatic from this I recommend aggressive medical management and continue antiplatelet therapy and strict blood pressure control with goal below 130/90 and lipids with LDL cholesterol goal below 70 mg percent. He was advised to keep his scheduled appointment to see me next month or call earlier if he has any recurrent neurological symptoms of stroke/TIA. He voiced understanding

## 2017-01-24 ENCOUNTER — Other Ambulatory Visit (HOSPITAL_COMMUNITY): Payer: Self-pay | Admitting: Family Medicine

## 2017-01-24 DIAGNOSIS — I674 Hypertensive encephalopathy: Secondary | ICD-10-CM

## 2017-01-25 ENCOUNTER — Encounter (HOSPITAL_COMMUNITY): Payer: Medicare Other

## 2017-02-15 ENCOUNTER — Telehealth (INDEPENDENT_AMBULATORY_CARE_PROVIDER_SITE_OTHER): Payer: Self-pay | Admitting: *Deleted

## 2017-02-15 NOTE — Telephone Encounter (Signed)
Ok to do. Thank you.  

## 2017-02-15 NOTE — Telephone Encounter (Signed)
Can you please pass this to whomever is working with Dr Lorin Mercy tomorrow afternoon?? It just needs to be filled out and signed by Dr Lorin Mercy when he gets to clinic tomorrow. Patient aware will not be ready for pick up until after 2pm

## 2017-02-15 NOTE — Telephone Encounter (Signed)
IC s/w patient at length due to his frustration of not being able to pick up placard today, advised him that he could pick up after 2pm at [facility] location tomorrow afternoon. Apologized for inconvenience

## 2017-02-15 NOTE — Telephone Encounter (Signed)
Pt came in with his wife, stated he left handicapped placard to be filled out. It was not in the drawer.

## 2017-02-15 NOTE — Telephone Encounter (Signed)
Placard application was put in drawer December 2017. It likely was shredded if it isnt there.  will have to fill out another if Dr Lorin Mercy is ok with it he will not be able to pick it up until tomorrow since Dr Lorin Mercy is in [place] today.

## 2017-02-15 NOTE — Telephone Encounter (Signed)
Ok to renew? One had been filled out in December and I am assuming patient never came to pick it up. Patient had TKA in 2016. Has not been seen since 09/2015. Please advise.

## 2017-02-16 NOTE — Telephone Encounter (Signed)
Placard has put at the front desk for pick up

## 2017-02-17 DIAGNOSIS — E039 Hypothyroidism, unspecified: Secondary | ICD-10-CM | POA: Diagnosis not present

## 2017-02-17 DIAGNOSIS — M791 Myalgia: Secondary | ICD-10-CM | POA: Diagnosis not present

## 2017-02-17 DIAGNOSIS — I10 Essential (primary) hypertension: Secondary | ICD-10-CM | POA: Diagnosis not present

## 2017-02-17 DIAGNOSIS — R51 Headache: Secondary | ICD-10-CM | POA: Diagnosis not present

## 2017-02-19 ENCOUNTER — Ambulatory Visit: Payer: Self-pay | Admitting: Endocrinology

## 2017-02-19 DIAGNOSIS — Z0289 Encounter for other administrative examinations: Secondary | ICD-10-CM

## 2017-03-12 ENCOUNTER — Ambulatory Visit: Payer: Medicare Other | Admitting: Neurology

## 2017-04-29 IMAGING — CR DG CHEST 2V
2 series · 2 of 2 positions shown · non-contrast
Comparison: 05/26/2011.

CLINICAL DATA: Hypertension.

EXAM:
CHEST  2 VIEW

[w chest pa]
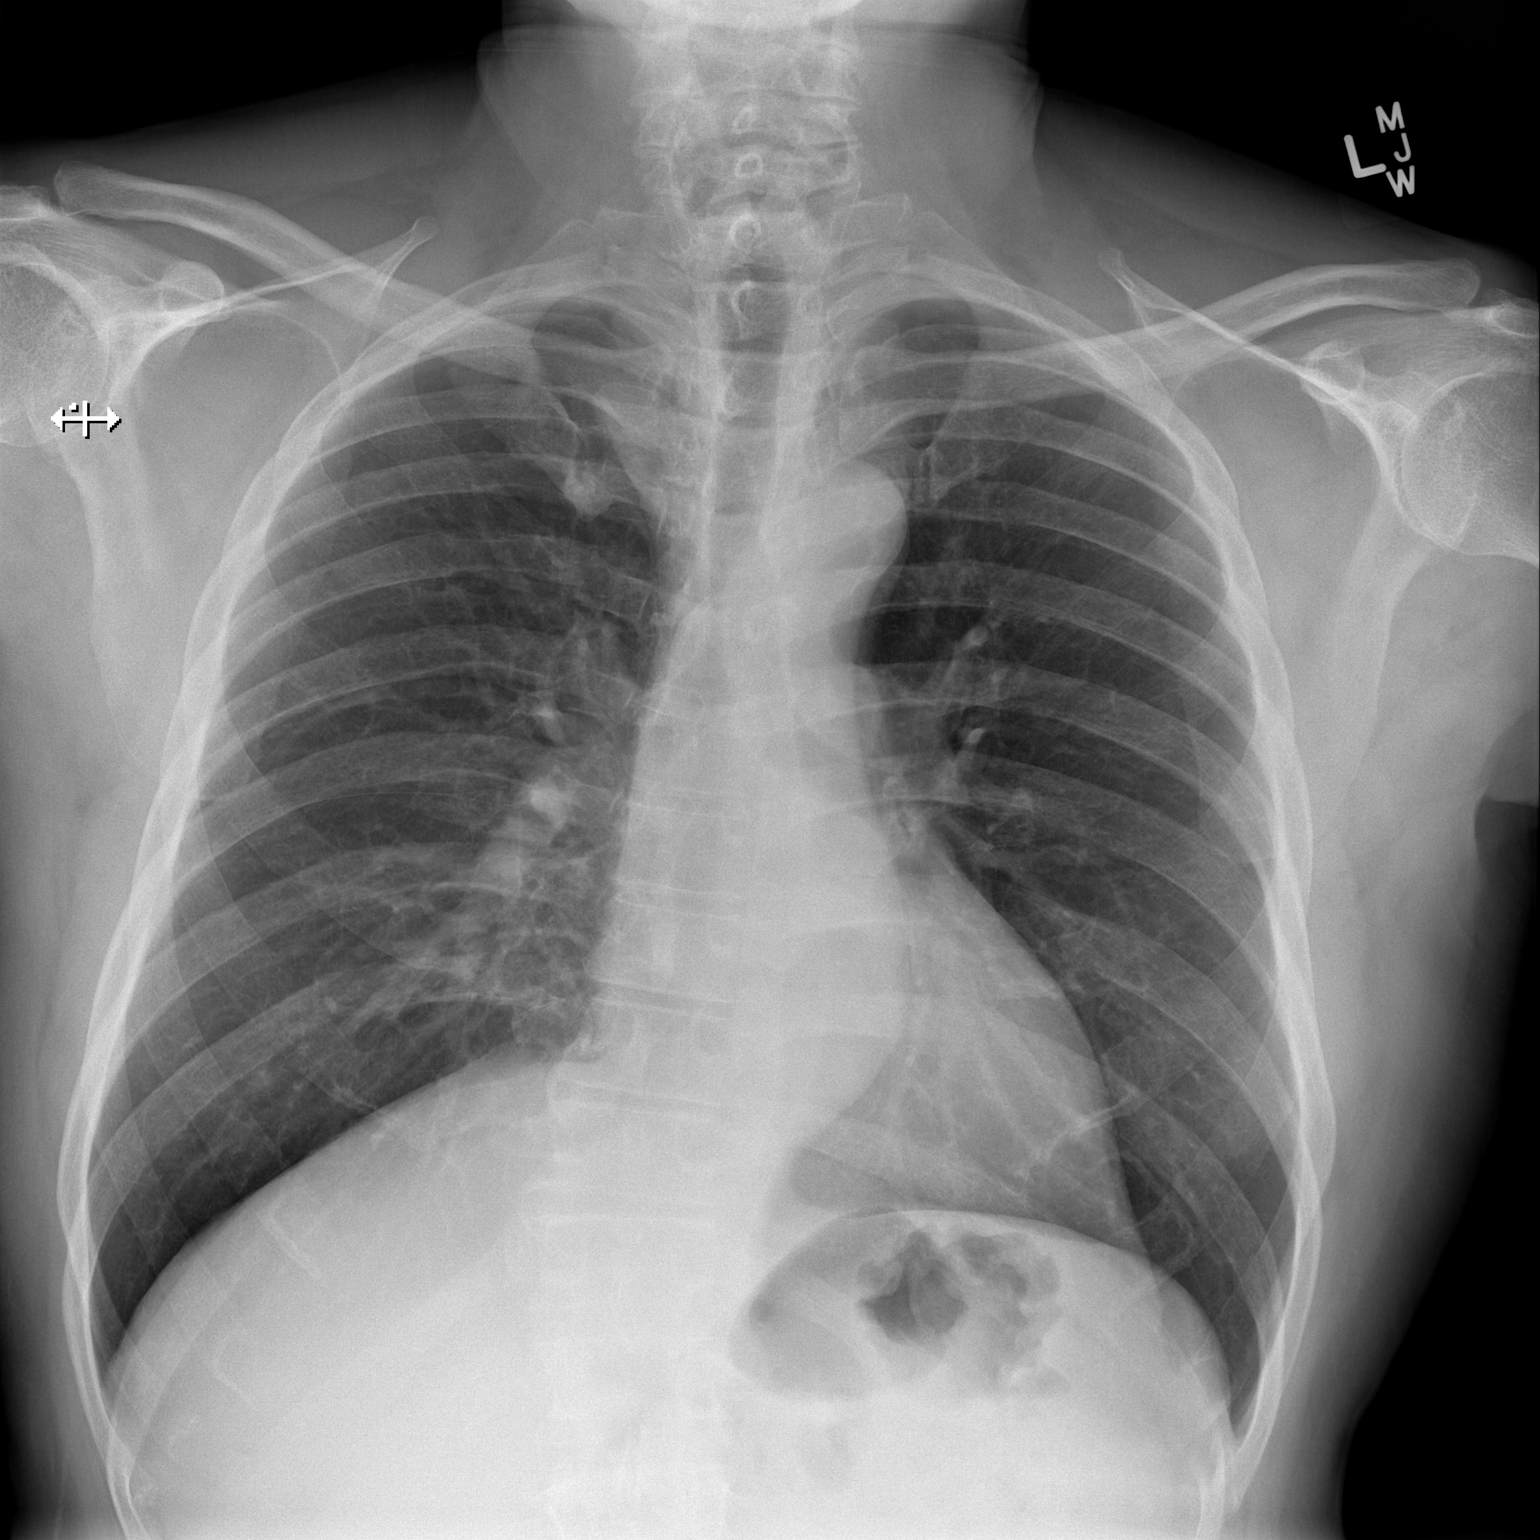

[w chest lat]
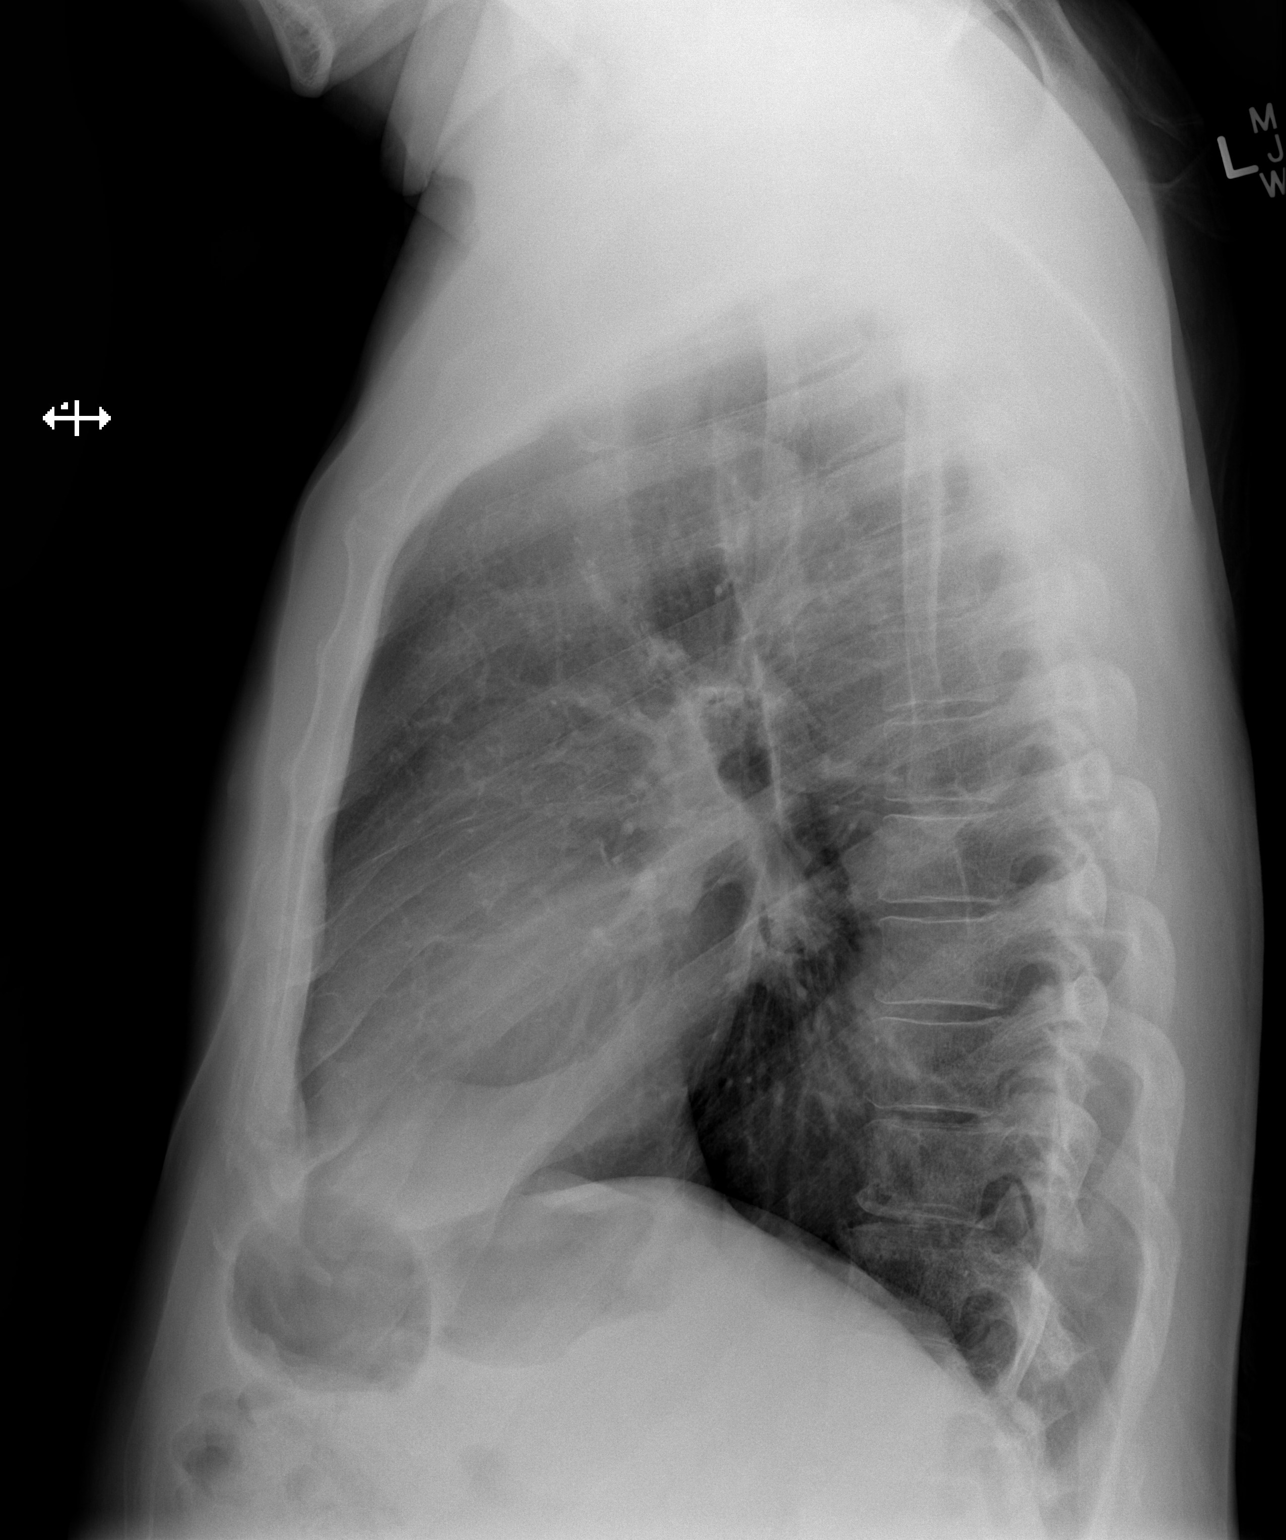

[2 of 2 positions shown; findings below may reference images not displayed]

FINDINGS: Mediastinum and hilar structures are normal. Lungs are clear of
acute infiltrates. Stable atelectasis right lung base. No pleural
effusion or pneumothorax. Heart size stable. Stable scoliosis.
IMPRESSION: Stable exam with stable atelectasis right lung base. No acute
abnormality .

## 2017-06-02 DIAGNOSIS — I10 Essential (primary) hypertension: Secondary | ICD-10-CM | POA: Diagnosis not present

## 2017-06-02 DIAGNOSIS — Z23 Encounter for immunization: Secondary | ICD-10-CM | POA: Diagnosis not present

## 2017-06-02 DIAGNOSIS — R000 Tachycardia, unspecified: Secondary | ICD-10-CM | POA: Diagnosis not present

## 2017-06-02 DIAGNOSIS — E039 Hypothyroidism, unspecified: Secondary | ICD-10-CM | POA: Diagnosis not present

## 2017-06-02 DIAGNOSIS — Z0189 Encounter for other specified special examinations: Secondary | ICD-10-CM | POA: Diagnosis not present

## 2017-06-29 DIAGNOSIS — Z961 Presence of intraocular lens: Secondary | ICD-10-CM | POA: Diagnosis not present

## 2017-06-29 DIAGNOSIS — H401132 Primary open-angle glaucoma, bilateral, moderate stage: Secondary | ICD-10-CM | POA: Diagnosis not present

## 2017-06-29 DIAGNOSIS — H353132 Nonexudative age-related macular degeneration, bilateral, intermediate dry stage: Secondary | ICD-10-CM | POA: Diagnosis not present

## 2017-06-29 DIAGNOSIS — H2512 Age-related nuclear cataract, left eye: Secondary | ICD-10-CM | POA: Diagnosis not present

## 2017-07-14 DIAGNOSIS — I10 Essential (primary) hypertension: Secondary | ICD-10-CM | POA: Diagnosis not present

## 2017-07-24 DIAGNOSIS — H353132 Nonexudative age-related macular degeneration, bilateral, intermediate dry stage: Secondary | ICD-10-CM | POA: Diagnosis not present

## 2017-07-24 DIAGNOSIS — H43811 Vitreous degeneration, right eye: Secondary | ICD-10-CM | POA: Diagnosis not present

## 2017-07-24 DIAGNOSIS — H2512 Age-related nuclear cataract, left eye: Secondary | ICD-10-CM | POA: Diagnosis not present

## 2017-08-01 DIAGNOSIS — Z961 Presence of intraocular lens: Secondary | ICD-10-CM | POA: Diagnosis not present

## 2017-10-09 DIAGNOSIS — E039 Hypothyroidism, unspecified: Secondary | ICD-10-CM | POA: Diagnosis not present

## 2017-10-09 DIAGNOSIS — I10 Essential (primary) hypertension: Secondary | ICD-10-CM | POA: Diagnosis not present

## 2017-10-09 DIAGNOSIS — R040 Epistaxis: Secondary | ICD-10-CM | POA: Diagnosis not present

## 2017-10-09 DIAGNOSIS — M79602 Pain in left arm: Secondary | ICD-10-CM | POA: Diagnosis not present

## 2017-10-19 ENCOUNTER — Other Ambulatory Visit: Payer: Self-pay | Admitting: Family Medicine

## 2017-10-19 DIAGNOSIS — M542 Cervicalgia: Secondary | ICD-10-CM

## 2017-10-19 DIAGNOSIS — M25512 Pain in left shoulder: Secondary | ICD-10-CM

## 2017-10-24 ENCOUNTER — Other Ambulatory Visit: Payer: Medicare Other

## 2017-12-19 DIAGNOSIS — I10 Essential (primary) hypertension: Secondary | ICD-10-CM | POA: Diagnosis not present

## 2018-01-21 DIAGNOSIS — I10 Essential (primary) hypertension: Secondary | ICD-10-CM | POA: Diagnosis not present

## 2018-03-25 DIAGNOSIS — E785 Hyperlipidemia, unspecified: Secondary | ICD-10-CM | POA: Diagnosis not present

## 2018-03-25 DIAGNOSIS — E039 Hypothyroidism, unspecified: Secondary | ICD-10-CM | POA: Diagnosis not present

## 2018-03-25 DIAGNOSIS — I10 Essential (primary) hypertension: Secondary | ICD-10-CM | POA: Diagnosis not present

## 2018-06-24 DIAGNOSIS — Z0000 Encounter for general adult medical examination without abnormal findings: Secondary | ICD-10-CM | POA: Diagnosis not present

## 2018-06-24 DIAGNOSIS — I10 Essential (primary) hypertension: Secondary | ICD-10-CM | POA: Diagnosis not present

## 2018-06-24 DIAGNOSIS — E039 Hypothyroidism, unspecified: Secondary | ICD-10-CM | POA: Diagnosis not present

## 2018-09-23 DIAGNOSIS — E039 Hypothyroidism, unspecified: Secondary | ICD-10-CM | POA: Diagnosis not present

## 2018-09-23 DIAGNOSIS — I10 Essential (primary) hypertension: Secondary | ICD-10-CM | POA: Diagnosis not present

## 2018-11-27 IMAGING — MR MR HEAD WO/W CM
12 series · 48 of 48 positions shown · IV contrast (multihance)
Comparison: None.

CLINICAL DATA: Initial evaluation for hypertensive encephalopathy,
headaches, memory loss for 3 weeks.

EXAM:
MRI HEAD WITHOUT AND WITH CONTRAST
TECHNIQUE: Multiplanar, multiecho pulse sequences of the brain and surrounding
structures were obtained without and with intravenous contrast.
CONTRAST:  19mL MULTIHANCE GADOBENATE DIMEGLUMINE 529 MG/ML IV SOLN

[Series 2: t1_se_sag · sagittal · 5.0mm · 0.45mm/px · 1 of 21 slices shown]
[im 1/21]
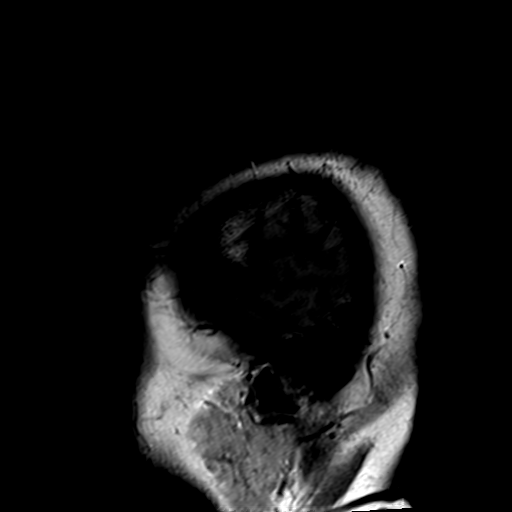

[Series 3: t2_tse_tra_512 · axial · 5.0mm · 0.60mm/px · 1 of 26 slices shown]
[im 1/26]
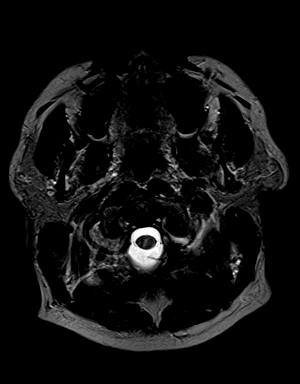

[Series 4: ep2d_diff_(id)_trace · axial · 3.0mm · 1.80mm/px · z∈[-65,+90]mm · 7 of 98 slices shown]
[im 1/98]
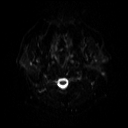
[im 17/98]
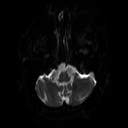
[im 33/98]
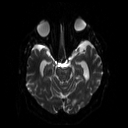
[im 49/98]
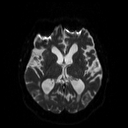
[im 65/98]
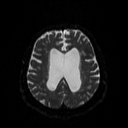
[im 81/98]
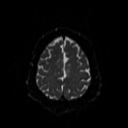
[im 98/98]
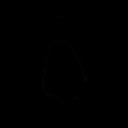

[Series 5: ep2d_diff_(id)_trace_adc · axial · 3.0mm · 1.80mm/px · z∈[-65,+90]mm · 3 of 50 slices shown]
[im 1/50]
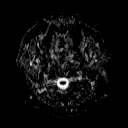
[im 25/50]
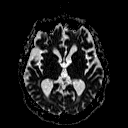
[im 50/50]
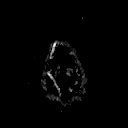

[Series 6: ep2d_diff_cor · coronal · 5.0mm · 1.77mm/px · 3 of 50 slices shown]
[im 1/50]
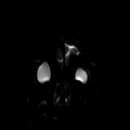
[im 25/50]
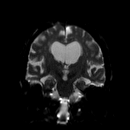
[im 50/50]
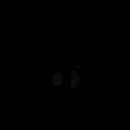

[Series 7: ep2d_diff_cor_adc · coronal · 5.0mm · 1.77mm/px · 2 of 25 slices shown]
[im 1/25]
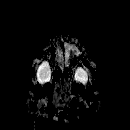
[im 25/25]
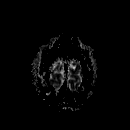

[Series 9: swi_images · axial · 2.0mm · 0.90mm/px · z∈[-68,+90]mm · 5 of 80 slices shown]
[im 1/80]
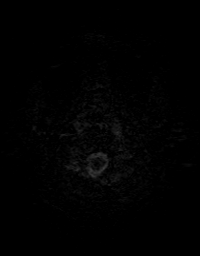
[im 20/80]
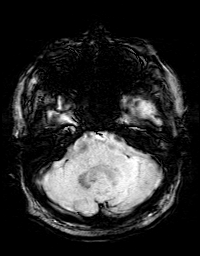
[im 40/80]
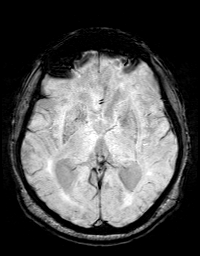
[im 60/80]
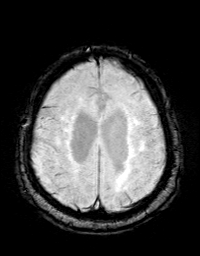
[im 80/80]
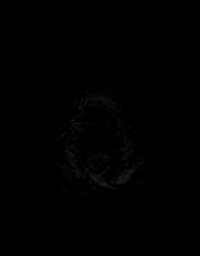

[Series 10: FLAIR · axial · 3.0mm · 0.43mm/px · z∈[-64,+88]mm · 2 of 30 slices shown]
[im 1/30]
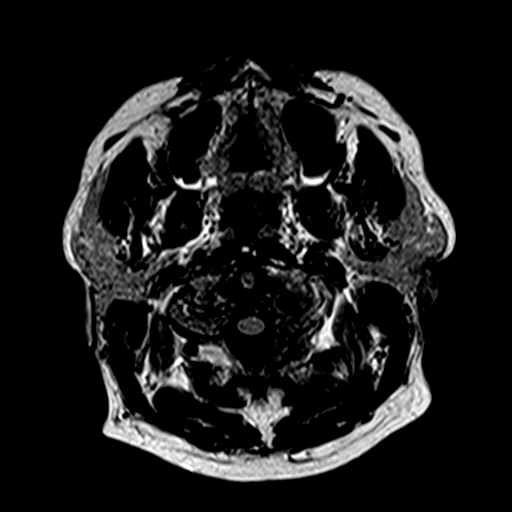
[im 30/30]
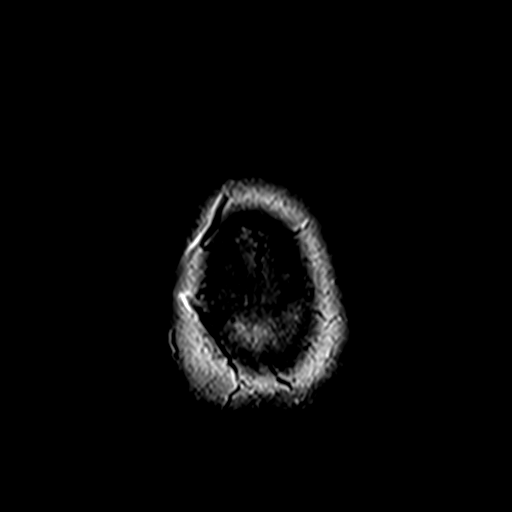

[Series 11: t1_mpr_tra · axial · 1.1mm · 0.72mm/px · z∈[-69,+92]mm · 10 of 144 slices shown]
[im 1/144]
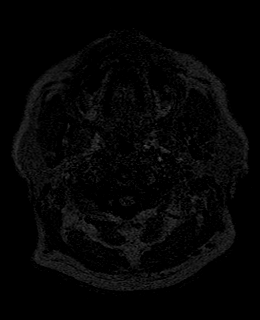
[im 16/144]
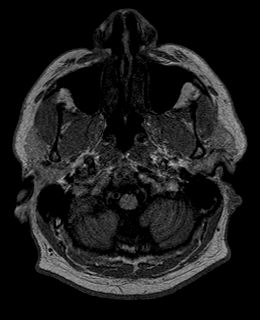
[im 32/144]
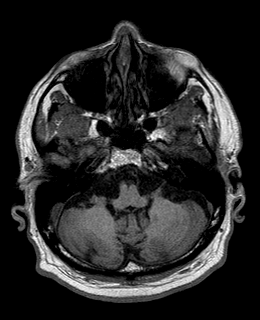
[im 48/144]
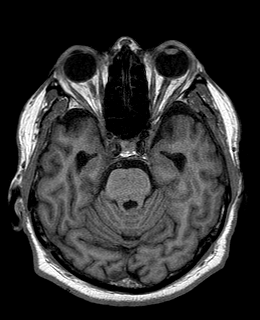
[im 64/144]
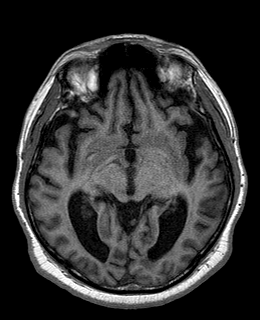
[im 80/144]
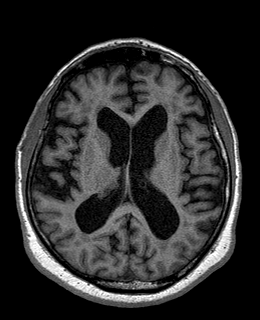
[im 96/144]
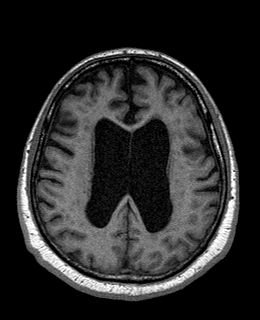
[im 112/144]
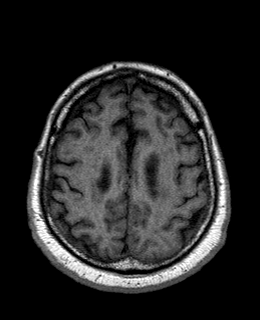
[im 128/144]
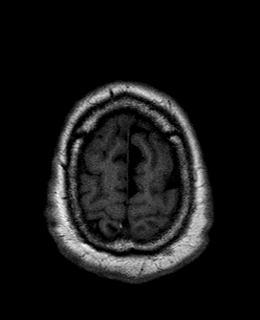
[im 144/144]
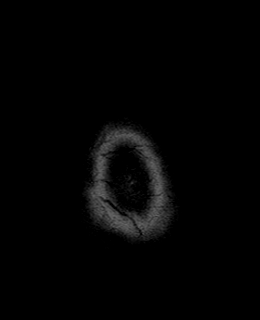

[Series 12: T2 · coronal · 5.0mm · 0.45mm/px · 2 of 26 slices shown]
[im 1/26]
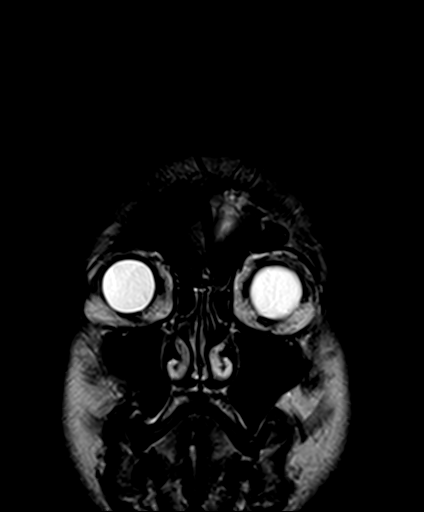
[im 26/26]
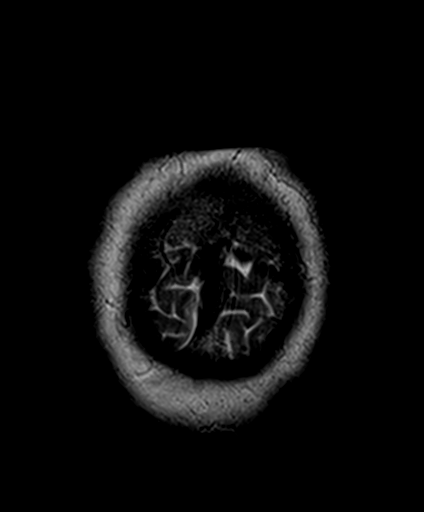

[Series 13: post t1_mpr_tra · axial · 1.1mm · 0.72mm/px · z∈[-69,+92]mm · 10 of 144 slices shown]
[im 1/144]
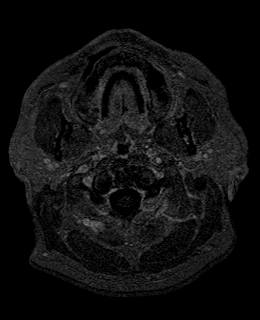
[im 16/144]
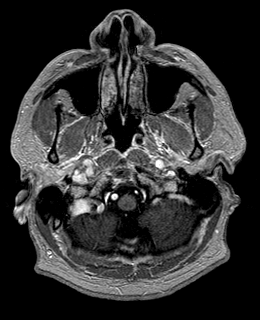
[im 32/144]
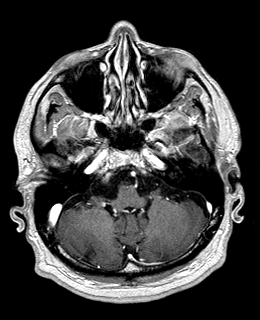
[im 48/144]
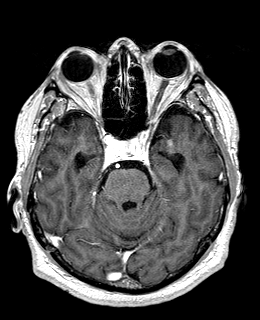
[im 64/144]
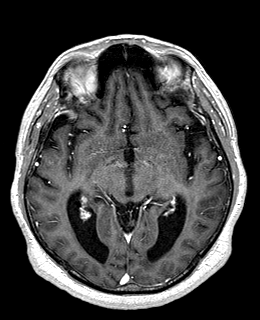
[im 80/144]
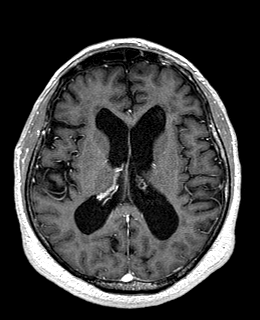
[im 96/144]
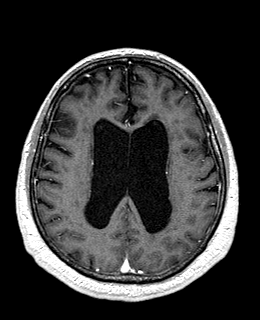
[im 112/144]
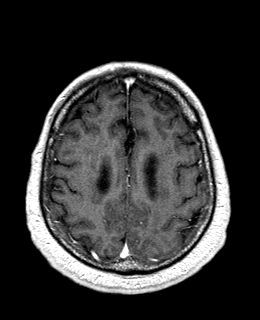
[im 128/144]
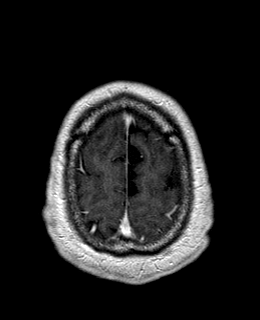
[im 144/144]
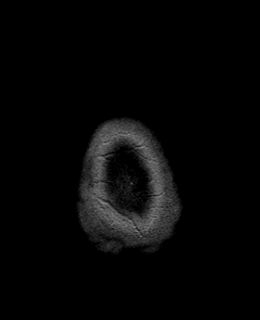

[Series 14: T1 post-contrast · coronal · 5.0mm · 0.45mm/px · 2 of 26 slices shown]
[im 1/26]
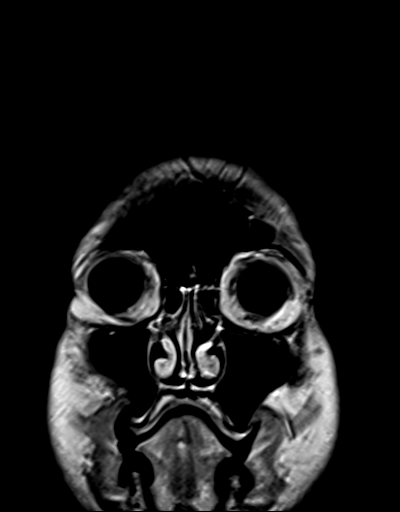
[im 26/26]
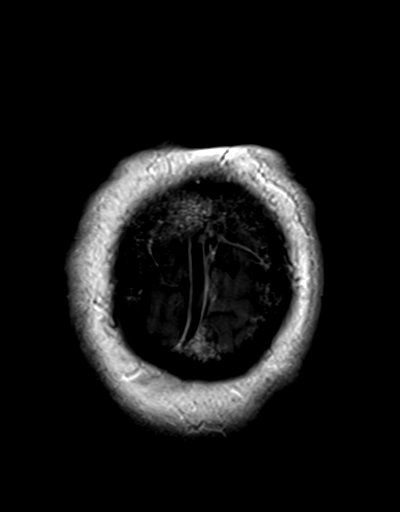

[48 of 48 positions shown; findings below may reference images not displayed]

FINDINGS: Brain: Diffuse prominence of the CSF containing spaces is compatible
with generalized cerebral atrophy. Patchy and confluent T2/FLAIR
hyperintensity within the periventricular and deep white matter both
cerebral hemispheres most consistent with chronic small vessel
ischemic disease, moderate nature. Mild chronic microvascular
changes present within the pons.

No abnormal foci of restricted diffusion to suggest acute or
subacute ischemia. Gray-white matter differentiation maintained. No
encephalomalacia to suggest chronic infarction. No evidence for
acute or chronic intracranial hemorrhage. No findings suggest PRES.

No mass lesion, midline shift or mass effect. Diffuse ventricular
prominence related to global parenchymal volume loss of
hydrocephalus. No extra-axial fluid collection. Major dural sinuses
are grossly patent. No abnormal enhancement.

Pituitary gland suprasellar region within normal limits.

Vascular: Major intracranial vascular flow voids are maintained.

Skull and upper cervical spine: Craniocervical junction within
normal limits. Visualized upper cervical spine normal. Bone marrow
signal intensity within normal limits. No scalp soft tissue
abnormality.

Sinuses/Orbits: Globes and orbital soft tissues within normal
limits. Patient status post lens extraction on the right. Paranasal
sinuses are clear. Trace left mastoid effusion noted. Right mastoid
air cells clear. Inner ear structures normal.

Other: No other significant finding.
IMPRESSION: 1. No acute intracranial process identified.
2. Moderate cerebral atrophy with chronic microvascular ischemic
disease.

## 2019-04-23 DIAGNOSIS — E782 Mixed hyperlipidemia: Secondary | ICD-10-CM | POA: Diagnosis not present

## 2019-04-23 DIAGNOSIS — E039 Hypothyroidism, unspecified: Secondary | ICD-10-CM | POA: Diagnosis not present

## 2019-04-23 DIAGNOSIS — M130 Polyarthritis, unspecified: Secondary | ICD-10-CM | POA: Diagnosis not present

## 2019-04-23 DIAGNOSIS — I10 Essential (primary) hypertension: Secondary | ICD-10-CM | POA: Diagnosis not present

## 2019-04-23 DIAGNOSIS — M545 Low back pain: Secondary | ICD-10-CM | POA: Diagnosis not present

## 2019-08-18 DIAGNOSIS — R7309 Other abnormal glucose: Secondary | ICD-10-CM | POA: Diagnosis not present

## 2019-08-18 DIAGNOSIS — M25552 Pain in left hip: Secondary | ICD-10-CM | POA: Diagnosis not present

## 2019-08-18 DIAGNOSIS — M159 Polyosteoarthritis, unspecified: Secondary | ICD-10-CM | POA: Diagnosis not present

## 2019-08-18 DIAGNOSIS — E782 Mixed hyperlipidemia: Secondary | ICD-10-CM | POA: Diagnosis not present

## 2019-08-18 DIAGNOSIS — I10 Essential (primary) hypertension: Secondary | ICD-10-CM | POA: Diagnosis not present

## 2019-08-21 ENCOUNTER — Ambulatory Visit
Admission: RE | Admit: 2019-08-21 | Discharge: 2019-08-21 | Disposition: A | Discharge status: Home / Self Care | Payer: Medicare Other | Source: Ambulatory Visit | Attending: Nurse Practitioner | Admitting: Nurse Practitioner

## 2019-08-21 ENCOUNTER — Other Ambulatory Visit: Payer: Self-pay | Admitting: Nurse Practitioner

## 2019-08-21 DIAGNOSIS — M545 Low back pain, unspecified: Secondary | ICD-10-CM

## 2019-09-08 DIAGNOSIS — M545 Low back pain: Secondary | ICD-10-CM | POA: Diagnosis not present

## 2019-09-08 DIAGNOSIS — M130 Polyarthritis, unspecified: Secondary | ICD-10-CM | POA: Diagnosis not present

## 2019-10-13 ENCOUNTER — Ambulatory Visit: Payer: Medicare Other | Attending: Internal Medicine

## 2019-10-13 DIAGNOSIS — Z961 Presence of intraocular lens: Secondary | ICD-10-CM | POA: Diagnosis not present

## 2019-10-13 DIAGNOSIS — H401132 Primary open-angle glaucoma, bilateral, moderate stage: Secondary | ICD-10-CM | POA: Diagnosis not present

## 2019-10-13 DIAGNOSIS — Z23 Encounter for immunization: Secondary | ICD-10-CM

## 2019-10-13 DIAGNOSIS — H2512 Age-related nuclear cataract, left eye: Secondary | ICD-10-CM | POA: Diagnosis not present

## 2019-10-13 DIAGNOSIS — H353132 Nonexudative age-related macular degeneration, bilateral, intermediate dry stage: Secondary | ICD-10-CM | POA: Diagnosis not present

## 2019-10-13 DIAGNOSIS — H179 Unspecified corneal scar and opacity: Secondary | ICD-10-CM | POA: Diagnosis not present

## 2019-10-13 NOTE — Progress Notes (Signed)
   Covid-19 Vaccination Clinic  Name:  Jeffery Price    MRN: [id number] DOB: 1950/09/18  10/13/2019  [name] was observed post Covid-19 immunization for 15 minutes without incidence. He was provided with Vaccine Information Sheet and instruction to access the V-Safe system.   [name] was instructed to call 911 with any severe reactions post vaccine: Marland Kitchen Difficulty breathing  . Swelling of your face and throat  . A fast heartbeat  . A bad rash all over your body  . Dizziness and weakness    Immunizations Administered    Name Date Dose VIS Date Route   Pfizer COVID-19 Vaccine 10/13/2019  2:01 PM 0.3 mL 07/25/2019 Intramuscular   Manufacturer: [place]   Lot: [id number]   [place]: [id number]

## 2019-11-05 ENCOUNTER — Ambulatory Visit: Payer: Medicare Other | Attending: Internal Medicine

## 2019-11-05 DIAGNOSIS — Z23 Encounter for immunization: Secondary | ICD-10-CM

## 2019-11-05 NOTE — Progress Notes (Signed)
   Covid-19 Vaccination Clinic  Name:  BRENTIN ALANA    MRN: [id number] DOB: [birth year]  11/05/2019  Mr. Scallion was observed post Covid-19 immunization for 15 minutes without incident. He was provided with Vaccine Information Sheet and instruction to access the V-Safe system.   Mr. Nylen was instructed to call 911 with any severe reactions post vaccine: Marland Kitchen Difficulty breathing  . Swelling of face and throat  . A fast heartbeat  . A bad rash all over body  . Dizziness and weakness   Immunizations Administered    Name Date Dose VIS Date Route   Pfizer COVID-19 Vaccine 11/05/2019  2:00 PM 0.3 mL 07/25/2019 Intramuscular   Manufacturer: [place]   Lot: [id number]   [place]: [id number]

## 2019-12-15 DIAGNOSIS — H524 Presbyopia: Secondary | ICD-10-CM | POA: Diagnosis not present

## 2019-12-15 DIAGNOSIS — H2512 Age-related nuclear cataract, left eye: Secondary | ICD-10-CM | POA: Diagnosis not present

## 2019-12-15 DIAGNOSIS — H35713 Central serous chorioretinopathy, bilateral: Secondary | ICD-10-CM | POA: Diagnosis not present

## 2019-12-15 DIAGNOSIS — H52223 Regular astigmatism, bilateral: Secondary | ICD-10-CM | POA: Diagnosis not present

## 2020-01-14 DIAGNOSIS — E782 Mixed hyperlipidemia: Secondary | ICD-10-CM | POA: Diagnosis not present

## 2020-01-14 DIAGNOSIS — E785 Hyperlipidemia, unspecified: Secondary | ICD-10-CM | POA: Diagnosis not present

## 2020-01-14 DIAGNOSIS — M545 Low back pain: Secondary | ICD-10-CM | POA: Diagnosis not present

## 2020-01-14 DIAGNOSIS — Z0000 Encounter for general adult medical examination without abnormal findings: Secondary | ICD-10-CM | POA: Diagnosis not present

## 2020-05-17 DIAGNOSIS — Z7189 Other specified counseling: Secondary | ICD-10-CM | POA: Diagnosis not present

## 2020-05-17 DIAGNOSIS — I10 Essential (primary) hypertension: Secondary | ICD-10-CM | POA: Diagnosis not present

## 2020-05-17 DIAGNOSIS — E785 Hyperlipidemia, unspecified: Secondary | ICD-10-CM | POA: Diagnosis not present

## 2020-05-17 DIAGNOSIS — Z23 Encounter for immunization: Secondary | ICD-10-CM | POA: Diagnosis not present

## 2020-08-05 DIAGNOSIS — R040 Epistaxis: Secondary | ICD-10-CM | POA: Diagnosis not present

## 2020-09-07 DIAGNOSIS — R040 Epistaxis: Secondary | ICD-10-CM | POA: Diagnosis not present

## 2020-09-07 DIAGNOSIS — Z7189 Other specified counseling: Secondary | ICD-10-CM | POA: Diagnosis not present

## 2020-09-07 DIAGNOSIS — G894 Chronic pain syndrome: Secondary | ICD-10-CM | POA: Diagnosis not present

## 2020-09-30 DIAGNOSIS — R040 Epistaxis: Secondary | ICD-10-CM | POA: Diagnosis not present

## 2021-02-07 DIAGNOSIS — Z0001 Encounter for general adult medical examination with abnormal findings: Secondary | ICD-10-CM | POA: Diagnosis not present

## 2021-02-07 DIAGNOSIS — E785 Hyperlipidemia, unspecified: Secondary | ICD-10-CM | POA: Diagnosis not present

## 2021-02-07 DIAGNOSIS — E039 Hypothyroidism, unspecified: Secondary | ICD-10-CM | POA: Diagnosis not present

## 2021-02-07 DIAGNOSIS — I10 Essential (primary) hypertension: Secondary | ICD-10-CM | POA: Diagnosis not present

## 2021-02-07 DIAGNOSIS — M159 Polyosteoarthritis, unspecified: Secondary | ICD-10-CM | POA: Diagnosis not present

## 2021-02-07 DIAGNOSIS — M25552 Pain in left hip: Secondary | ICD-10-CM | POA: Diagnosis not present

## 2021-02-22 DIAGNOSIS — R2231 Localized swelling, mass and lump, right upper limb: Secondary | ICD-10-CM | POA: Diagnosis not present

## 2021-03-13 DIAGNOSIS — E785 Hyperlipidemia, unspecified: Secondary | ICD-10-CM | POA: Diagnosis not present

## 2021-03-13 DIAGNOSIS — I10 Essential (primary) hypertension: Secondary | ICD-10-CM | POA: Diagnosis not present

## 2021-03-13 DIAGNOSIS — E039 Hypothyroidism, unspecified: Secondary | ICD-10-CM | POA: Diagnosis not present

## 2021-03-15 DIAGNOSIS — M25521 Pain in right elbow: Secondary | ICD-10-CM | POA: Diagnosis not present

## 2021-04-13 DIAGNOSIS — I10 Essential (primary) hypertension: Secondary | ICD-10-CM | POA: Diagnosis not present

## 2021-04-13 DIAGNOSIS — E785 Hyperlipidemia, unspecified: Secondary | ICD-10-CM | POA: Diagnosis not present

## 2021-04-13 DIAGNOSIS — E039 Hypothyroidism, unspecified: Secondary | ICD-10-CM | POA: Diagnosis not present

## 2021-06-13 DIAGNOSIS — E785 Hyperlipidemia, unspecified: Secondary | ICD-10-CM | POA: Diagnosis not present

## 2021-06-13 DIAGNOSIS — I10 Essential (primary) hypertension: Secondary | ICD-10-CM | POA: Diagnosis not present

## 2021-06-13 DIAGNOSIS — E039 Hypothyroidism, unspecified: Secondary | ICD-10-CM | POA: Diagnosis not present

## 2021-07-13 DIAGNOSIS — E039 Hypothyroidism, unspecified: Secondary | ICD-10-CM | POA: Diagnosis not present

## 2021-07-13 DIAGNOSIS — I10 Essential (primary) hypertension: Secondary | ICD-10-CM | POA: Diagnosis not present

## 2021-07-13 DIAGNOSIS — E785 Hyperlipidemia, unspecified: Secondary | ICD-10-CM | POA: Diagnosis not present

## 2021-09-06 DIAGNOSIS — H25812 Combined forms of age-related cataract, left eye: Secondary | ICD-10-CM | POA: Diagnosis not present

## 2021-09-06 DIAGNOSIS — H16143 Punctate keratitis, bilateral: Secondary | ICD-10-CM | POA: Diagnosis not present

## 2021-09-06 DIAGNOSIS — H33001 Unspecified retinal detachment with retinal break, right eye: Secondary | ICD-10-CM | POA: Diagnosis not present

## 2021-09-07 DIAGNOSIS — H35711 Central serous chorioretinopathy, right eye: Secondary | ICD-10-CM | POA: Diagnosis not present

## 2021-09-07 DIAGNOSIS — H2589 Other age-related cataract: Secondary | ICD-10-CM | POA: Diagnosis not present

## 2021-09-07 DIAGNOSIS — H34831 Tributary (branch) retinal vein occlusion, right eye, with macular edema: Secondary | ICD-10-CM | POA: Diagnosis not present

## 2021-10-19 DIAGNOSIS — H34831 Tributary (branch) retinal vein occlusion, right eye, with macular edema: Secondary | ICD-10-CM | POA: Diagnosis not present

## 2024-04-25 ENCOUNTER — Other Ambulatory Visit: Payer: Self-pay

## 2024-04-25 DIAGNOSIS — E119 Type 2 diabetes mellitus without complications: Secondary | ICD-10-CM

## 2024-04-30 ENCOUNTER — Inpatient Hospital Stay: Admission: RE | Admit: 2024-04-30 | Source: Ambulatory Visit

## 2024-04-30 ENCOUNTER — Ambulatory Visit: Admission: RE | Admit: 2024-04-30 | Source: Ambulatory Visit

## 2024-05-01 ENCOUNTER — Other Ambulatory Visit
